# Patient Record
Sex: Male | Born: 1955 | Race: Black or African American | Hispanic: No | Marital: Single | Smoking: Current every day smoker
Health system: Southern US, Community
[De-identification: ages and names within clinical notes are randomized; demographics above are authoritative.]

## PROBLEM LIST (undated history)

## (undated) DIAGNOSIS — F259 Schizoaffective disorder, unspecified: Secondary | ICD-10-CM

## (undated) DIAGNOSIS — F319 Bipolar disorder, unspecified: Secondary | ICD-10-CM

## (undated) DIAGNOSIS — I10 Essential (primary) hypertension: Secondary | ICD-10-CM

## (undated) HISTORY — DX: Bipolar disorder, unspecified: F31.9

## (undated) HISTORY — DX: Schizoaffective disorder, unspecified: F25.9

---

## 1998-02-27 HISTORY — PX: HIP SURGERY: SHX245

## 2004-07-13 ENCOUNTER — Emergency Department: Payer: Self-pay | Admitting: General Practice

## 2007-04-23 ENCOUNTER — Emergency Department (HOSPITAL_COMMUNITY): Admission: EM | Admit: 2007-04-23 | Discharge: 2007-04-24 | Payer: Self-pay | Admitting: Emergency Medicine

## 2009-01-04 ENCOUNTER — Emergency Department: Payer: Self-pay | Admitting: Emergency Medicine

## 2010-11-18 LAB — I-STAT 8, (EC8 V) (CONVERTED LAB)
BUN: 11
Bicarbonate: 28.4 — ABNORMAL HIGH
Chloride: 104
HCT: 50
Hemoglobin: 17
Operator id: 277751
Potassium: 4.5
Sodium: 138

## 2012-12-22 ENCOUNTER — Emergency Department: Payer: Self-pay | Admitting: Emergency Medicine
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
## 2018-05-07 ENCOUNTER — Encounter: Payer: Self-pay | Admitting: Family Medicine

## 2018-05-07 ENCOUNTER — Ambulatory Visit: Payer: Medicaid Other | Admitting: Family Medicine

## 2018-05-07 VITALS — BP 172/108 | HR 67 | Temp 98.4°F | Resp 14 | Ht 66.5 in | Wt 193.8 lb

## 2018-05-07 DIAGNOSIS — Z1211 Encounter for screening for malignant neoplasm of colon: Secondary | ICD-10-CM

## 2018-05-07 DIAGNOSIS — Z23 Encounter for immunization: Secondary | ICD-10-CM | POA: Diagnosis not present

## 2018-05-07 DIAGNOSIS — Z114 Encounter for screening for human immunodeficiency virus [HIV]: Secondary | ICD-10-CM

## 2018-05-07 DIAGNOSIS — I10 Essential (primary) hypertension: Secondary | ICD-10-CM | POA: Diagnosis not present

## 2018-05-07 DIAGNOSIS — M25551 Pain in right hip: Secondary | ICD-10-CM

## 2018-05-07 DIAGNOSIS — Z5181 Encounter for therapeutic drug level monitoring: Secondary | ICD-10-CM

## 2018-05-07 DIAGNOSIS — Z1159 Encounter for screening for other viral diseases: Secondary | ICD-10-CM

## 2018-05-07 DIAGNOSIS — G8929 Other chronic pain: Secondary | ICD-10-CM

## 2018-05-07 DIAGNOSIS — R220 Localized swelling, mass and lump, head: Secondary | ICD-10-CM

## 2018-05-07 DIAGNOSIS — F209 Schizophrenia, unspecified: Secondary | ICD-10-CM

## 2018-05-07 DIAGNOSIS — H04203 Unspecified epiphora, bilateral lacrimal glands: Secondary | ICD-10-CM

## 2018-05-07 MED ORDER — AMLODIPINE BESYLATE 10 MG PO TABS: 10.0000 mg | ORAL_TABLET | Freq: Every day | ORAL | 0 refills | Status: DC

## 2018-05-07 NOTE — Patient Instructions (Addendum)
Start the new blood pressure medicine Try to follow the DASH guidelines (DASH stands for Dietary Approaches to Stop Hypertension). Try to limit the sodium in your diet to no more than 1,500mg  of sodium per day. Certainly try to not exceed 2,000 mg per day at the very most. Do not add salt when cooking or at the table.  Check the sodium amount on labels when shopping, and choose items lower in sodium when given a choice. Avoid or limit foods that already contain a lot of sodium. Eat a diet rich in fruits and vegetables and whole grains, and try to lose weight if overweight or obese We'll have you see the Ear Nose Throat doctor and orthopaedist and the eye doctor We'll get a scan of your neck/head We'll get labs today If you have not heard anything from my staff in a week about any orders/referrals/studies from today, please contact us here to follow-up [phone]
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
## 2018-05-08 ENCOUNTER — Telehealth: Payer: Self-pay

## 2018-05-08 ENCOUNTER — Other Ambulatory Visit: Payer: Self-pay

## 2018-05-08 ENCOUNTER — Other Ambulatory Visit: Payer: Self-pay | Admitting: Family Medicine

## 2018-05-08 DIAGNOSIS — E782 Mixed hyperlipidemia: Secondary | ICD-10-CM

## 2018-05-08 DIAGNOSIS — Z5181 Encounter for therapeutic drug level monitoring: Secondary | ICD-10-CM

## 2018-05-08 DIAGNOSIS — Z1211 Encounter for screening for malignant neoplasm of colon: Secondary | ICD-10-CM

## 2018-05-08 LAB — HEMOGLOBIN A1C
Hgb A1c MFr Bld: 5.6 % of total Hgb (ref <5.7)
Mean Plasma Glucose: 114 (calc)
eAG (mmol/L): 6.3 (calc)

## 2018-05-08 LAB — LIPID PANEL
Cholesterol: 175 mg/dL (ref <200)
HDL: 41 mg/dL (ref >=40)
LDL Cholesterol (Calc): 97 mg/dL (calc)
Non-HDL Cholesterol (Calc): 134 mg/dL (calc) — ABNORMAL HIGH (ref <130)
Total CHOL/HDL Ratio: 4.3 (calc) (ref <5.0)
Triglycerides: 242 mg/dL — ABNORMAL HIGH (ref <150)

## 2018-05-08 LAB — COMPLETE METABOLIC PANEL WITH GFR
AG RATIO: 1.4 (calc) (ref 1.0–2.5)
ALT: 14 U/L (ref 9–46)
AST: 16 U/L (ref 10–35)
Albumin: 4.2 g/dL (ref 3.6–5.1)
Alkaline phosphatase (APISO): 89 U/L (ref 35–144)
BILIRUBIN TOTAL: 0.6 mg/dL (ref 0.2–1.2)
BUN: 10 mg/dL (ref 7–25)
CHLORIDE: 105 mmol/L (ref 98–110)
CO2: 30 mmol/L (ref 20–32)
Calcium: 10.2 mg/dL (ref 8.6–10.3)
Creat: 1.12 mg/dL (ref 0.70–1.25)
GFR, EST AFRICAN AMERICAN: 82 mL/min/{1.73_m2} (ref >=60)
GFR, Est Non African American: 71 mL/min/{1.73_m2} (ref >=60)
GLUCOSE: 78 mg/dL (ref 65–99)
Globulin: 3.1 g/dL (calc) (ref 1.9–3.7)
POTASSIUM: 4.5 mmol/L (ref 3.5–5.3)
Sodium: 141 mmol/L (ref 135–146)
TOTAL PROTEIN: 7.3 g/dL (ref 6.1–8.1)

## 2018-05-08 LAB — HIV ANTIBODY (ROUTINE TESTING W REFLEX): HIV 1&2 Ab, 4th Generation: NONREACTIVE

## 2018-05-08 LAB — CBC
HEMATOCRIT: 46.7 % (ref 38.5–50.0)
HEMOGLOBIN: 16.2 g/dL (ref 13.2–17.1)
MCH: 29.9 pg (ref 27.0–33.0)
MCHC: 34.7 g/dL (ref 32.0–36.0)
MCV: 86.3 fL (ref 80.0–100.0)
MPV: 11.1 fL (ref 7.5–12.5)
Platelets: 191 10*3/uL (ref 140–400)
RBC: 5.41 10*6/uL (ref 4.20–5.80)
RDW: 13.5 % (ref 11.0–15.0)
WBC: 4.3 10*3/uL (ref 3.8–10.8)

## 2018-05-08 LAB — HEPATITIS C ANTIBODY
Hepatitis C Ab: NONREACTIVE
SIGNAL TO CUT-OFF: 0.11 (ref <1.00)

## 2018-05-08 MED ORDER — ATORVASTATIN CALCIUM 20 MG PO TABS: 20.0000 mg | ORAL_TABLET | Freq: Every day | ORAL | 1 refills | Status: DC

## 2018-05-08 NOTE — Progress Notes (Signed)
Start statin; recheck labs in 6 weeks

## 2018-05-08 NOTE — Telephone Encounter (Signed)
Gastroenterology Pre-Procedure Review  Request Date: 06/04/18 Requesting Physician: Dr. Vicente Males  PATIENT REVIEW QUESTIONS: The patient's daughter Ronald Bradford responded to the following health history questions as indicated:    1. Are you having any GI issues? no 2. Do you have a personal history of Polyps? no 3. Do you have a family history of Colon Cancer or Polyps? no 4. Diabetes Mellitus? no 5. Joint replacements in the past 12 months?no 6. Major health problems in the past 3 months?no 7. Any artificial heart valves, MVP, or defibrillator?no    MEDICATIONS & ALLERGIES:    Patient reports the following regarding taking any anticoagulation/antiplatelet therapy:   Plavix, Coumadin, Eliquis, Xarelto, Lovenox, Pradaxa, Brilinta, or Effient? no Aspirin? no  Patient confirms/reports the following medications:  Current Outpatient Medications  Medication Sig Dispense Refill  . amLODipine (NORVASC) 10 MG tablet Take 1 tablet (10 mg total) by mouth daily. 30 tablet 0  . risperiDONE (RISPERDAL) 3 MG tablet Take 3 mg by mouth daily.     No current facility-administered medications for this visit.     Patient confirms/reports the following allergies:  Allergies not on file  No orders of the defined types were placed in this encounter.   AUTHORIZATION INFORMATION Primary Insurance: 1D#: Group #:  Secondary Insurance: 1D#: Group #:  SCHEDULE INFORMATION: Date: 06/04/18 Time: Location:[facility]

## 2018-05-10 ENCOUNTER — Ambulatory Visit: Payer: Medicaid Other | Attending: Family Medicine

## 2018-05-10 ENCOUNTER — Ambulatory Visit: Payer: Medicaid Other

## 2018-05-12 ENCOUNTER — Encounter: Payer: Self-pay | Admitting: Family Medicine

## 2018-05-12 DIAGNOSIS — Z5181 Encounter for therapeutic drug level monitoring: Secondary | ICD-10-CM | POA: Insufficient documentation

## 2018-05-12 DIAGNOSIS — M25551 Pain in right hip: Secondary | ICD-10-CM

## 2018-05-12 DIAGNOSIS — R220 Localized swelling, mass and lump, head: Secondary | ICD-10-CM | POA: Insufficient documentation

## 2018-05-12 DIAGNOSIS — F209 Schizophrenia, unspecified: Secondary | ICD-10-CM | POA: Insufficient documentation

## 2018-05-12 DIAGNOSIS — G8929 Other chronic pain: Secondary | ICD-10-CM | POA: Insufficient documentation

## 2018-05-12 DIAGNOSIS — I10 Essential (primary) hypertension: Secondary | ICD-10-CM | POA: Insufficient documentation

## 2018-05-12 NOTE — Assessment & Plan Note (Signed)
On atypical antipsychotic, will check glucose, renal function, A1c

## 2018-05-12 NOTE — Assessment & Plan Note (Signed)
Uncontrolled; may have been uncontrolled for months or years; will check renal function before adding ARB or thiazide diuretic; will start with CCB, with close f/u for BP recheck with CMA later this week; DASH guidelines discussed, encouraged

## 2018-05-12 NOTE — Assessment & Plan Note (Signed)
Under the care of psychiatrist; patient agrees to not harm self or others, will seek help; they have crisis numbers; he is on medicine that can raise glucose and lpids, so I will check labs today as he does not recall any recent labs for those issues
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
## 2018-05-12 NOTE — Assessment & Plan Note (Signed)
Will get [place] and refer to ENT

## 2018-05-13 ENCOUNTER — Ambulatory Visit: Payer: Medicaid Other

## 2018-05-17 ENCOUNTER — Ambulatory Visit: Payer: Medicaid Other

## 2018-05-21 ENCOUNTER — Ambulatory Visit: Payer: Self-pay | Admitting: Nurse Practitioner

## 2018-05-21 ENCOUNTER — Ambulatory Visit: Payer: Medicaid Other | Admitting: Family Medicine

## 2018-05-22 ENCOUNTER — Telehealth: Payer: Self-pay

## 2018-05-22 NOTE — Telephone Encounter (Signed)
Left message with patients daughter to inform her father that his colonoscopy has been canceled for 06/04/18 due to COVID 19 and we will call back to reschedule once restrictions have been lifted.  Thanks [employer]

## 2018-05-28 ENCOUNTER — Telehealth: Payer: Self-pay

## 2018-05-28 NOTE — Telephone Encounter (Signed)
LVM asking patients daughter to call office to reschedule her fathers colonoscopy to June/July with Dr. Vicente Males.  Thanks [employer]

## 2018-06-04 ENCOUNTER — Encounter: Admission: RE | Payer: Self-pay | Source: Ambulatory Visit

## 2018-06-04 ENCOUNTER — Ambulatory Visit: Admission: RE | Admit: 2018-06-04 | Payer: Medicaid Other | Source: Ambulatory Visit | Admitting: Gastroenterology

## 2018-06-04 SURGERY — COLONOSCOPY WITH PROPOFOL
Anesthesia: General

## 2018-06-11 ENCOUNTER — Telehealth: Payer: Self-pay | Admitting: Family Medicine

## 2018-06-11 MED ORDER — AMLODIPINE BESYLATE 10 MG PO TABS: 10.0000 mg | ORAL_TABLET | Freq: Every day | ORAL | 0 refills | Status: DC

## 2018-06-11 NOTE — Telephone Encounter (Signed)
PEC not refilling medications for [facility] at this time

## 2018-06-11 NOTE — Telephone Encounter (Signed)
Patient was supposed to be seen by CMA in the office 3 days after last visit for BP check Please contact him about his BP; see if he has checked it elsewhere; I need to know what his BP is plesae

## 2018-06-12 NOTE — Telephone Encounter (Signed)
bp reading: 147/86 56

## 2018-06-12 NOTE — Telephone Encounter (Signed)
appt scheduled

## 2018-06-12 NOTE — Telephone Encounter (Signed)
Much improved BP; thank you Please schedule patient for a video visit in the next few weeks please

## 2018-06-25 ENCOUNTER — Ambulatory Visit: Payer: Medicaid Other | Admitting: Nurse Practitioner

## 2018-06-25 ENCOUNTER — Other Ambulatory Visit: Payer: Self-pay

## 2018-10-08 ENCOUNTER — Telehealth: Payer: Self-pay | Admitting: Family Medicine

## 2018-10-08 NOTE — Telephone Encounter (Signed)
Pt daughter is asking about 2 referrals that patient had before the covid. One to the hip doctor and one about a she called it a bubble in the mouth. Please give her a call to up date
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
## 2018-10-16 ENCOUNTER — Ambulatory Visit: Payer: Medicaid Other | Admitting: Nurse Practitioner

## 2018-10-18 ENCOUNTER — Encounter: Payer: Self-pay | Admitting: Nurse Practitioner

## 2018-10-18 ENCOUNTER — Other Ambulatory Visit: Payer: Self-pay

## 2018-10-18 ENCOUNTER — Ambulatory Visit (INDEPENDENT_AMBULATORY_CARE_PROVIDER_SITE_OTHER): Payer: Medicaid Other | Admitting: Nurse Practitioner

## 2018-10-18 VITALS — BP 182/102 | HR 66 | Temp 97.0°F | Resp 14 | Ht 66.5 in | Wt 203.1 lb

## 2018-10-18 DIAGNOSIS — G8929 Other chronic pain: Secondary | ICD-10-CM

## 2018-10-18 DIAGNOSIS — R220 Localized swelling, mass and lump, head: Secondary | ICD-10-CM

## 2018-10-18 DIAGNOSIS — I10 Essential (primary) hypertension: Secondary | ICD-10-CM

## 2018-10-18 DIAGNOSIS — F209 Schizophrenia, unspecified: Secondary | ICD-10-CM

## 2018-10-18 DIAGNOSIS — E785 Hyperlipidemia, unspecified: Secondary | ICD-10-CM | POA: Diagnosis not present

## 2018-10-18 DIAGNOSIS — M25551 Pain in right hip: Secondary | ICD-10-CM | POA: Diagnosis not present

## 2018-10-18 DIAGNOSIS — Z720 Tobacco use: Secondary | ICD-10-CM

## 2018-10-18 MED ORDER — AMLODIPINE BESYLATE 10 MG PO TABS: 10.0000 mg | ORAL_TABLET | Freq: Every day | ORAL | 0 refills | Status: DC

## 2018-10-18 MED ORDER — ATORVASTATIN CALCIUM 20 MG PO TABS: 20.0000 mg | ORAL_TABLET | Freq: Every day | ORAL | 3 refills | Status: DC

## 2018-10-18 NOTE — Progress Notes (Signed)
Name: Ronald Bradford   MRN: [id number]    DOB: [birth year]   Date:10/18/2018       Progress Note  Subjective  Chief Complaint  Chief Complaint  Patient presents with  . Follow-up  . Medication Refill  . referrals  . Hip Pain    HPI  Mass to right side of face States appeared about 15 years ago after dental infection and never was seen for it. Has not changed in size recently. Denies fevers, chills, unexplained weight loss. Does not chew tobacco.   Hypertension Patient is rx amlodipine 10mg  daily. He has not been taking this, has been out for about 2 months.  He is not compliant with low-salt diet.  Denies chest pain, headaches, blurry vision. BP Readings from Last 3 Encounters:  10/18/18 (!) 182/102  05/07/18 (!) 172/108    Hyperlipidemia Patient rx atorvastatin 20mg  daily. He has not been taking this  Diet: eats vegetables most days, rarely eating red meat  Denies myalgias Lab Results  Component Value Date   CHOL 175 05/07/2018   HDL 41 05/07/2018   LDLCALC 97 05/07/2018   TRIG 242 (H) 05/07/2018   CHOLHDL 4.3 05/07/2018    Schizophrenia Patient is taking risperdal 3mg  daily with no missed doses. Denies hallucinations or delusions. Affect is normal. Psychiatrist: [facility]  PHQ2/9: Depression screen [facility] 2/9 10/18/2018 05/07/2018  Decreased Interest 0 0  Down, Depressed, Hopeless 0 1  PHQ - 2 Score 0 1  Altered sleeping 0 1  Tired, decreased energy 0 1  Change in appetite 0 3  Feeling bad or failure about yourself  0 1  Trouble concentrating 0 1  Moving slowly or fidgety/restless 0 0  Suicidal thoughts 0 1  PHQ-9 Score 0 9  Difficult doing work/chores Not difficult at all Extremely dIfficult     PHQ reviewed. Negative  Patient Active Problem List   Diagnosis Date Noted  . Dyslipidemia 10/18/2018  . Tobacco use 10/18/2018  . Chronic pain of right hip 05/12/2018  . Localized swelling, mass and lump, head 05/12/2018  . Benign  hypertension 05/12/2018  . Medication monitoring encounter 05/12/2018  . Schizophrenia ([place]) 05/12/2018    Past Medical History:  Diagnosis Date  . Bipolar 1 disorder ([place])   . Schizoaffective disorder [facility])     Past Surgical History:  Procedure Laterality Date  . HIP SURGERY Right 2000   not sure what was done, not sure if ORIF, no pins    Social History   Tobacco Use  . Smoking status: Current Every Day Smoker    Packs/day: 1.00    Types: Cigarettes  . Smokeless tobacco: Former Network engineer Use Topics  . Alcohol use: Yes    Comment: on holidays     Current Outpatient Medications:  .  risperiDONE (RISPERDAL) 3 MG tablet, Take 3 mg by mouth daily., Disp: , Rfl:  .  amLODipine (NORVASC) 10 MG tablet, Take 1 tablet (10 mg total) by mouth daily., Disp: 30 tablet, Rfl: 0 .  atorvastatin (LIPITOR) 20 MG tablet, Take 1 tablet (20 mg total) by mouth at bedtime., Disp: 90 tablet, Rfl: 3  No Known Allergies  ROS    No other specific complaints in a complete review of systems (except as listed in HPI above).  Objective  Vitals:   10/18/18 1427 10/18/18 1523  BP: (!) 180/100 (!) 182/102  Pulse: 66   Resp: 14   Temp: (!) 97 F (36.1 C)   SpO2: 95%  Weight: 203 lb 1.6 oz (92.1 kg)   Height: 5' 6.5" (1.689 m)      Body mass index is 32.29 kg/m.  Nursing Note and Vital Signs reviewed.  Physical Exam Constitutional:      Appearance: Normal appearance.  HENT:     Head: Normocephalic and atraumatic.   Neck:     Musculoskeletal: No neck rigidity or muscular tenderness.  Cardiovascular:     Rate and Rhythm: Normal rate and regular rhythm.  Pulmonary:     Effort: Pulmonary effort is normal.     Breath sounds: Normal breath sounds.  Musculoskeletal: Normal range of motion.  Skin:    General: Skin is warm and dry.  Neurological:     General: No focal deficit present.     Mental Status: He is alert and oriented to person, place, and time.  Psychiatric:         Mood and Affect: Mood normal.        Behavior: Behavior normal.        Thought Content: Thought content normal.        Judgment: Judgment normal.        No results found for this or any previous visit (from the past 48 hour(s)).  Assessment & Plan  1. Benign hypertension Discussed red flag ER symptoms  - amLODipine (NORVASC) 10 MG tablet; Take 1 tablet (10 mg total) by mouth daily.  Dispense: 30 tablet; Refill: 0  2. Chronic pain of right hip Requesting ortho referral  - Ambulatory referral to Orthopedic Surgery  3. Schizophrenia, unspecified type ([place]) Stable, continue psych follow-up   4. Dyslipidemia - atorvastatin (LIPITOR) 20 MG tablet; Take 1 tablet (20 mg total) by mouth at bedtime.  Dispense: 90 tablet; Refill: 3  5. Tobacco use Smoking 3 cigarettes a day, working on cutting it out   6. Localized swelling, mass and lump, head Unchanged no need for urgent imaging  - Ambulatory referral to ENT

## 2018-10-18 NOTE — Patient Instructions (Addendum)
- please call 911 and get immediate medical attention if you develop chest pain, dizziness, trouble breathing, numbness or tingling or weakness of one side of your body.  DASH Eating Plan    Avoid eating more than 1,800 mg (milligrams) of salt (sodium) a day.  Limit alcohol intake to no more than 1 drink a day for nonpregnant women and 2 drinks a day for men. One drink equals 12 oz of beer, 5 oz of wine, or 1 oz of hard liquor.  Work with your health care provider to maintain a healthy body weight or to lose weight. Ask what an ideal weight is for you.  Get at least 30 minutes of exercise that causes your heart to beat faster (aerobic exercise) most days of the week. Activities may include walking, swimming, or biking.  Reading food labels  Check food labels for the amount of sodium per serving. Choose foods with less than 5 percent of the Daily Value of sodium. Generally, foods with less than 300 mg of sodium per serving fit into this eating plan.  To find whole grains, look for the word "whole" as the first word in the ingredient list. Shopping  Buy products labeled as "low-sodium" or "no salt added."  Buy fresh foods. Avoid canned foods and premade or frozen meals. Cooking  Avoid adding salt when cooking. Use salt-free seasonings or herbs instead of table salt or sea salt. Check with your health care provider or pharmacist before using salt substitutes.  Do not fry foods. Cook foods using healthy methods such as baking, boiling, grilling, and broiling instead.  Cook with heart-healthy oils, such as olive, canola, soybean, or sunflower oil. Meal planning  Eat a balanced diet that includes: ? 5 or more servings of fruits and vegetables each day. At each meal, try to fill half of your plate with fruits and vegetables. ? Up to 6-8 servings of whole grains each day. ? Less than 6 oz of lean meat, poultry, or fish each day. A 3-oz serving of meat is about the same size as a deck of  cards. One egg equals 1 oz. ? 2 servings of low-fat dairy each day. ? A serving of nuts, seeds, or beans 5 times each week. ? Heart-healthy fats. Healthy fats called Omega-3 fatty acids are found in foods such as flaxseeds and coldwater fish, like sardines, salmon, and mackerel.  Limit how much you eat of the following: ? Canned or prepackaged foods. ? Food that is high in trans fat, such as fried foods. ? Food that is high in saturated fat, such as fatty meat. ? Sweets, desserts, sugary drinks, and other foods with added sugar. ? Full-fat dairy products.  Do not salt foods before eating.  Try to eat at least 2 vegetarian meals each week.  Eat more home-cooked food and less restaurant, buffet, and fast food.  When eating at a restaurant, ask that your food be prepared with less salt or no salt, if possible. What foods are recommended? The items listed may not be a complete list. Talk with your dietitian about what dietary choices are best for you. Grains Whole-grain or whole-wheat bread. Whole-grain or whole-wheat pasta. Brown rice. [place]. Bulgur. Whole-grain and low-sodium cereals. Pita bread. Low-fat, low-sodium crackers. Whole-wheat flour tortillas. Vegetables Fresh or frozen vegetables (raw, steamed, roasted, or grilled). Low-sodium or reduced-sodium tomato and vegetable juice. Low-sodium or reduced-sodium tomato sauce and tomato paste. Low-sodium or reduced-sodium canned vegetables. Fruits All fresh, dried, or frozen fruit.  Canned fruit in natural juice (without added sugar). Meat and other protein foods Skinless chicken or [place]. Ground chicken or [place]. Pork with fat trimmed off. Fish and seafood. Egg whites. Dried beans, peas, or lentils. Unsalted nuts, nut butters, and seeds. Unsalted canned beans. Lean cuts of beef with fat trimmed off. Low-sodium, lean deli meat. Dairy Low-fat (1%) or fat-free (skim) milk. Fat-free, low-fat, or reduced-fat cheeses. Nonfat,  low-sodium ricotta or cottage cheese. Low-fat or nonfat yogurt. Low-fat, low-sodium cheese. Fats and oils Soft margarine without trans fats. Vegetable oil. Low-fat, reduced-fat, or light mayonnaise and salad dressings (reduced-sodium). Canola, safflower, olive, soybean, and sunflower oils. Avocado. Seasoning and other foods Herbs. Spices. Seasoning mixes without salt. Unsalted popcorn and pretzels. Fat-free sweets. What foods are not recommended? Grains Baked goods made with fat, such as croissants, muffins, or some breads. Dry pasta or rice meal packs. Vegetables Creamed or fried vegetables. Vegetables in a cheese sauce. Regular canned vegetables (not low-sodium or reduced-sodium). Regular canned tomato sauce and paste (not low-sodium or reduced-sodium). Regular tomato and vegetable juice (not low-sodium or reduced-sodium). Angie Fava. Olives. Fruits Canned fruit in a light or heavy syrup. Fried fruit. Fruit in cream or butter sauce. Meat and other protein foods Fatty cuts of meat. Ribs. Fried meat. Berniece Salines. Sausage. Bologna and other processed lunch meats. Salami. Fatback. Hotdogs. Bratwurst. Salted nuts and seeds. Canned beans with added salt. Canned or smoked fish. Whole eggs or egg yolks. Chicken or [place] with skin. Dairy Whole or 2% milk, cream, and half-and-half. Whole or full-fat cream cheese. Whole-fat or sweetened yogurt. Full-fat cheese. Nondairy creamers. Whipped toppings. Processed cheese and cheese spreads. Fats and oils Butter. Stick margarine. Lard. Shortening. Ghee. Bacon fat. Tropical oils, such as coconut, palm kernel, or palm oil. Seasoning and other foods Salted popcorn and pretzels. Onion salt, garlic salt, seasoned salt, table salt, and sea salt. Worcestershire sauce. Tartar sauce. Barbecue sauce. Teriyaki sauce. Soy sauce, including reduced-sodium. Steak sauce. Canned and packaged gravies. Fish sauce. Oyster sauce. Cocktail sauce. Horseradish that you find on the shelf.  Ketchup. Mustard. Meat flavorings and tenderizers. Bouillon cubes. Hot sauce and Tabasco sauce. Premade or packaged marinades. Premade or packaged taco seasonings. Relishes. Regular salad dressings. Where to find more information:  National Heart, Lung, and [place]: https://wilson-eaton.com/  American Heart Association: www.heart.org

## 2018-10-25 ENCOUNTER — Ambulatory Visit (INDEPENDENT_AMBULATORY_CARE_PROVIDER_SITE_OTHER): Payer: Medicaid Other | Admitting: Nurse Practitioner

## 2018-10-25 ENCOUNTER — Encounter: Payer: Self-pay | Admitting: Nurse Practitioner

## 2018-10-25 ENCOUNTER — Other Ambulatory Visit: Payer: Self-pay

## 2018-10-25 VITALS — BP 132/66 | HR 87 | Temp 97.1°F | Resp 14 | Ht 66.0 in | Wt 201.5 lb

## 2018-10-25 DIAGNOSIS — I10 Essential (primary) hypertension: Secondary | ICD-10-CM | POA: Diagnosis not present

## 2018-10-25 NOTE — Progress Notes (Signed)
Name: Ronald Bradford   MRN: [id number]    DOB: [birth year]   Date:10/25/2018       Progress Note  Subjective  Chief Complaint  Chief Complaint  Patient presents with  . Follow-up  . Hypertension    HPI  Patient presents for one-week follow-up on blood pressure.  He was prescribed amlodipine 10 mg but had been out of it for about 2 months and was very noncompliant with Dash guidelines.  Last visit started taking his amlodipine daily without any missed doses and has been eating healthier.  Denies headaches, chest pain, dizziness, blurry vision.   PHQ2/9: Depression screen [facility] 2/9 10/25/2018 10/18/2018 05/07/2018  Decreased Interest 0 0 0  Down, Depressed, Hopeless 0 0 1  PHQ - 2 Score 0 0 1  Altered sleeping 0 0 1  Tired, decreased energy 0 0 1  Change in appetite 0 0 3  Feeling bad or failure about yourself  0 0 1  Trouble concentrating 0 0 1  Moving slowly or fidgety/restless 0 0 0  Suicidal thoughts 0 0 1  PHQ-9 Score 0 0 9  Difficult doing work/chores Not difficult at all Not difficult at all Extremely dIfficult    PHQ reviewed. Negative  Patient Active Problem List   Diagnosis Date Noted  . Dyslipidemia 10/18/2018  . Tobacco use 10/18/2018  . Chronic pain of right hip 05/12/2018  . Localized swelling, mass and lump, head 05/12/2018  . Benign hypertension 05/12/2018  . Medication monitoring encounter 05/12/2018  . Schizophrenia ([place]) 05/12/2018    Past Medical History:  Diagnosis Date  . Bipolar 1 disorder ([place])   . Schizoaffective disorder [facility])     Past Surgical History:  Procedure Laterality Date  . HIP SURGERY Right 2000   not sure what was done, not sure if ORIF, no pins    Social History   Tobacco Use  . Smoking status: Current Every Day Smoker    Packs/day: 1.00    Types: Cigarettes  . Smokeless tobacco: Former Network engineer Use Topics  . Alcohol use: Yes    Comment: on holidays     Current Outpatient Medications:  .  amLODipine  (NORVASC) 10 MG tablet, Take 1 tablet (10 mg total) by mouth daily., Disp: 30 tablet, Rfl: 0 .  atorvastatin (LIPITOR) 20 MG tablet, Take 1 tablet (20 mg total) by mouth at bedtime., Disp: 90 tablet, Rfl: 3 .  risperiDONE (RISPERDAL) 3 MG tablet, Take 3 mg by mouth daily., Disp: , Rfl:   No Known Allergies  Review of Systems  Constitutional: Negative for chills, fever and malaise/fatigue.  Respiratory: Negative for cough and shortness of breath.   Cardiovascular: Negative for chest pain, palpitations and leg swelling.  Gastrointestinal: Negative for abdominal pain.  Musculoskeletal: Negative for joint pain and myalgias.  Skin: Negative for rash.  Neurological: Negative for dizziness and headaches.  Psychiatric/Behavioral: The patient is not nervous/anxious and does not have insomnia.      No other specific complaints in a complete review of systems (except as listed in HPI above).  Objective  Vitals:   10/25/18 1413 10/25/18 1427  BP: (!) 152/80 134/66  Pulse: 87   Resp: 14   Temp: (!) 97.1 F (36.2 C)   SpO2: 99%   Weight: 201 lb 8 oz (91.4 kg)   Height: 5\' 6"  (1.676 m)      Body mass index is 32.52 kg/m.  Nursing Note and Vital Signs reviewed.  Physical Exam Constitutional:  Appearance: Normal appearance. He is well-developed.  HENT:     Head: Normocephalic and atraumatic.     Right Ear: Hearing normal.     Left Ear: Hearing normal.  Eyes:     Conjunctiva/sclera: Conjunctivae normal.  Cardiovascular:     Rate and Rhythm: Normal rate and regular rhythm.     Heart sounds: Normal heart sounds.  Pulmonary:     Effort: Pulmonary effort is normal.     Breath sounds: Normal breath sounds.  Musculoskeletal: Normal range of motion.  Neurological:     Mental Status: He is alert and oriented to person, place, and time.  Psychiatric:        Speech: Speech normal.        Behavior: Behavior normal. Behavior is cooperative.        Thought Content: Thought content  normal.        Judgment: Judgment normal.        No results found for this or any previous visit (from the past 48 hour(s)).  Assessment & Plan  1. Benign hypertension Much improved continue meds, routine follow-up in 4 months.

## 2018-10-25 NOTE — Patient Instructions (Signed)
10 Tips to Follow the DASH Diet on a Budget   Eating healthy doesn't have to be expensive. The following list shares ten simple tips to get you eating the Dietary Approaches to Stop Hypertension (DASH) Diet way without depleting your bank account.  Plan meals and snacks for the week Decide which recipes to make based on your pantry and freezer staples. Then prepare your grocery list by checking the newspaper for sales, store specials, and coupons and making a list of all the items you intend to purchase.  Use your grocery store loyalty cards for extra rewards. Also, eat before you shop to keep from buying foods that aren't on your list.  Choose unprocessed foods Unprocessed (nature-made) foods are cheaper and more nutritious than processed (man-made) foods. They also give you a lot more control over the ingredients. Avoid anything that comes in a box 90 percent of the time.  The golden rule is to buy and eat more nature-made food than man-made food.  Purchase in [place] in bulk, especially when stores are having promotions. Foods such as meat, pasta, rice, and canned goods are easy to stockpile because they last a long time in the freezer or pantry. If they're on sale, buy as much as you can afford and store them until the next big sale.  Buy a side of beef or a family pack at the grocery store or meat market. When you get home, split it into zipper bags and freeze. This way you'll have better quality meat that will last you a long time. You can also purchase whole-grain breads that are on sale and freeze some for later use. They'll keep for up to three months in the freezer.  Consider shopping at discount stores as well. Some big-box stores offer deep discounts, and many have their own store brands. Most of the time you get an equal-quality product for so much less.  Select in-season produce Not only is in-season produce more readily available, but it also has a better flavor and is more  budget-friendly. Buy some fruit that still needs time to ripen if you don't plan to use it right away. If you have the space in your freezer, buy extra in-season produce and freeze some so you can have it on hand in the off-season months.  Berries, for example, are super easy to freeze. Just rinse, let dry, then place into zippered freezer bags (these come in handy for a frozen sweet treat or for yogurt smoothies).  Buy store brands Buying store brand items can save you quite a bit of cash without sacrificing important nutrients. When you compare different brands of canned vegetables or cottage cheese, for example, odds are the store brand is more economical for the same good quality.  Skip convenience foods Convenience foods (think pre-cut fruits and veggies) can really add up at the cash register. They can also be very high in sodium (think frozen dinners and meals-in-a-box). Preparing items yourself inevitably saves money and is usually healthier.  Buy food from local farmers The best-quality produce comes from your local farmers because the food doesn't have to travel very far to get to your table. Find out about [employer] underwriter) in your area, buy into a farmer's crop for the season, or hit up a nearby farmers' market.  Grow your own veggies and herbs Whether in a plot in your backyard or in a community garden, you can grow fresh, flavorful, and inexpensive produce for your meals. Fresh  herbs, tomatoes, spinach, salad greens, onions, and peppers are the easiest for the novice gardener to grow. You can even grow herbs in a pot on the kitchen counter.  Cook at home Eating out can be expensive. Save money by cooking meals at home. Prepare bigger batches of food and freeze some for later use in individual containers. Also, try incorporating leftovers into your meals. Cook a meal once and utilize it in a variety of ways for a few other days.  Go meatless once a week Buying  meat, poultry, and fish for every day of the week adds up. Try eating more plant-based protein (beans, peas, and lentils) as well as eggs and peanut butter, which are low-cost items available year-round.

## 2018-11-26 ENCOUNTER — Other Ambulatory Visit: Payer: Self-pay | Admitting: Family Medicine

## 2018-11-26 DIAGNOSIS — I10 Essential (primary) hypertension: Secondary | ICD-10-CM

## 2018-11-26 MED ORDER — AMLODIPINE BESYLATE 10 MG PO TABS: 10.0000 mg | ORAL_TABLET | Freq: Every day | ORAL | 0 refills | Status: DC

## 2018-11-26 NOTE — Telephone Encounter (Signed)
Requested medication (s) are due for refill today: yes  Requested medication (s) are on the active medication list: yes  Last refill:  10/18/2018  Future visit scheduled: yes  Notes to clinic:  Ordering provider and pcp are different   Requested Prescriptions  Pending Prescriptions Disp Refills   amLODipine (NORVASC) 10 MG tablet 30 tablet 0    Sig: Take 1 tablet (10 mg total) by mouth daily.     Cardiovascular:  Calcium Channel Blockers Passed - 11/26/2018 10:26 AM      Passed - Last BP in normal range    BP Readings from Last 1 Encounters:  10/25/18 132/66         Passed - Valid encounter within last 6 months    Recent Outpatient Visits          1 month ago Benign hypertension   [place], NP   1 month ago Benign hypertension   [place], NP   6 months ago Localized swelling, mass and lump, head   [facility] [place], Satira Anis, MD      Future Appointments            In 3 months Delsa Grana, PA-C [facility], [facility]

## 2018-11-26 NOTE — Telephone Encounter (Signed)
Medication Refill - Medication: amLODipine (NORVASC) 10 MG tablet    Preferred Pharmacy (with phone number or street name):  [id number] - [address], [place] [phone] (Phone) [phone] (Fax)

## 2018-12-30 ENCOUNTER — Other Ambulatory Visit: Payer: Self-pay

## 2018-12-30 ENCOUNTER — Telehealth: Payer: Self-pay | Admitting: Family Medicine

## 2018-12-30 ENCOUNTER — Telehealth: Payer: Self-pay

## 2018-12-30 DIAGNOSIS — I10 Essential (primary) hypertension: Secondary | ICD-10-CM

## 2018-12-30 MED ORDER — AMLODIPINE BESYLATE 10 MG PO TABS: 10.0000 mg | ORAL_TABLET | Freq: Every day | ORAL | 0 refills | Status: DC

## 2018-12-30 NOTE — Telephone Encounter (Signed)
Copied from [place] [phone]. Topic: Referral - Status >> Dec 30, 2018 11:33 AM Reyne Dumas L wrote: Reason for CRM:   Pt's daughter, Earlie Counts, calling to check on status of referral for bubble on pt's jaw. [place] can be reached at [phone]  [facility], do you know anything about this?

## 2018-12-30 NOTE — Telephone Encounter (Signed)
I called this patient to inform her of the message from [facility] (listed below) but there was no answer. A message was left informing her that as soon as his card has been corrected with our information on it then we will be able to proceed with this referral.   A CRM will be placed.  Type Date User Summary Attachment  General 10/22/2018 9:32 AM Elenora Fender, Laverda Sorenson, CMA - -  Note   Fax from [place] [facility] states that this patient's medicaid card has [facility] on it so they will not be able to scheduled until it has been changed to our office.

## 2018-12-30 NOTE — Telephone Encounter (Signed)
Medication Refill - Medication: amLODipine (NORVASC) 10 MG tablet  Has the patient contacted their pharmacy? yes (Agent: If no, request that the patient contact the pharmacy for the refill.) (Agent: If yes, when and what did the pharmacy advise?)  Preferred Pharmacy (with phone number or street name):  [id number] - [address], [place] - [place] [phone] (Phone) [phone] (Fax)   Agent: Please be advised that RX refills may take up to 3 business days. We ask that you follow-up with your pharmacy.

## 2019-02-24 ENCOUNTER — Ambulatory Visit: Payer: Medicaid Other | Admitting: Family Medicine

## 2019-04-08 ENCOUNTER — Other Ambulatory Visit: Payer: Self-pay

## 2019-04-08 DIAGNOSIS — I10 Essential (primary) hypertension: Secondary | ICD-10-CM

## 2019-04-08 MED ORDER — AMLODIPINE BESYLATE 10 MG PO TABS: 10.0000 mg | ORAL_TABLET | Freq: Every day | ORAL | 0 refills | Status: DC

## 2019-04-21 ENCOUNTER — Ambulatory Visit (INDEPENDENT_AMBULATORY_CARE_PROVIDER_SITE_OTHER): Payer: Medicaid Other | Admitting: Family Medicine

## 2019-04-21 DIAGNOSIS — Z91199 Patient's noncompliance with other medical treatment and regimen due to unspecified reason: Secondary | ICD-10-CM

## 2019-04-21 DIAGNOSIS — Z5329 Procedure and treatment not carried out because of patient's decision for other reasons: Secondary | ICD-10-CM

## 2019-04-25 NOTE — Progress Notes (Signed)
No answer

## 2019-06-09 ENCOUNTER — Other Ambulatory Visit: Payer: Self-pay | Admitting: Family Medicine

## 2019-06-09 DIAGNOSIS — I10 Essential (primary) hypertension: Secondary | ICD-10-CM

## 2019-06-09 MED ORDER — AMLODIPINE BESYLATE 10 MG PO TABS: 10.0000 mg | ORAL_TABLET | Freq: Every day | ORAL | 0 refills | Status: DC

## 2019-06-09 NOTE — Telephone Encounter (Signed)
Medication Refill - Medication: amLODipine (NORVASC) 10 MG tablet [id number]  Pt is out of meds but as appt in may for med refills    Has the patient contacted their pharmacy? No. (Agent: If no, request that the patient contact the pharmacy for the refill.) (Agent: If yes, when and what did the pharmacy advise?)  Preferred Pharmacy (with phone number or street name): [facility] NOT [facility] one on [address]  Agent: Please be advised that RX refills may take up to 3 business days. We ask that you follow-up with your pharmacy.

## 2019-06-09 NOTE — Telephone Encounter (Signed)
Pt has upcoming appt, approved for 30 days as courtesy refill. Requested Prescriptions  Pending Prescriptions Disp Refills  . amLODipine (NORVASC) 10 MG tablet 30 tablet 0    Sig: Take 1 tablet (10 mg total) by mouth daily.     Cardiovascular:  Calcium Channel Blockers Passed - 06/09/2019  2:20 PM      Passed - Last BP in normal range    BP Readings from Last 1 Encounters:  10/25/18 132/66         Passed - Valid encounter within last 6 months    Recent Outpatient Visits          1 month ago No-show for appointment   [facility] Delsa Grana, PA-C   7 months ago Benign hypertension   [place], NP   7 months ago Benign hypertension   [place], NP   1 year ago Localized swelling, mass and lump, head   [facility] [place], Satira Anis, MD      Future Appointments            In 1 month Delsa Grana, PA-C [facility], [facility]

## 2019-07-11 ENCOUNTER — Encounter: Payer: Self-pay | Admitting: Family Medicine

## 2019-07-11 ENCOUNTER — Ambulatory Visit (INDEPENDENT_AMBULATORY_CARE_PROVIDER_SITE_OTHER): Payer: Medicaid Other | Admitting: Family Medicine

## 2019-07-11 VITALS — Ht 69.0 in | Wt 225.0 lb

## 2019-07-11 DIAGNOSIS — G8929 Other chronic pain: Secondary | ICD-10-CM

## 2019-07-11 DIAGNOSIS — E785 Hyperlipidemia, unspecified: Secondary | ICD-10-CM | POA: Diagnosis not present

## 2019-07-11 DIAGNOSIS — I10 Essential (primary) hypertension: Secondary | ICD-10-CM | POA: Diagnosis not present

## 2019-07-11 DIAGNOSIS — F209 Schizophrenia, unspecified: Secondary | ICD-10-CM | POA: Diagnosis not present

## 2019-07-11 DIAGNOSIS — M25551 Pain in right hip: Secondary | ICD-10-CM

## 2019-07-11 DIAGNOSIS — Z7409 Other reduced mobility: Secondary | ICD-10-CM

## 2019-07-11 DIAGNOSIS — Z789 Other specified health status: Secondary | ICD-10-CM

## 2019-07-11 MED ORDER — AMLODIPINE BESYLATE 10 MG PO TABS: 10.0000 mg | ORAL_TABLET | Freq: Every day | ORAL | 3 refills | Status: DC

## 2019-07-11 MED ORDER — ATORVASTATIN CALCIUM 20 MG PO TABS: 20.0000 mg | ORAL_TABLET | Freq: Every day | ORAL | 3 refills | Status: AC

## 2019-07-11 NOTE — Progress Notes (Signed)
Name: Ronald Bradford   MRN: [id number]    DOB: 1955/12/13   Date:07/11/2019       Progress Note  Subjective:    I connected with  Ronald Bradford  on 07/11/19 at  1:20 PM EDT by a video enabled telemedicine application and verified that I am speaking with the correct person using two identifiers.  I discussed the limitations of evaluation and management by telemedicine and the availability of in person appointments. The patient expressed understanding and agreed to proceed. Staff also discussed with the patient that there may be a patient responsible charge related to this service. Patient Location: home Provider [facility] Additional Individuals present: daughter  Chief Complaint  Patient presents with  . Follow-up    patient would like a Education officer, museum for assistance with her Dad  . Hypertension  . Schizophrenia  . Manic Behavior    Ronald Bradford is a 64 y.o. male, presents for virtual visit for routine follow up on the conditions listed above.  High BP today-patient's daughter took his blood pressure in between her visit in his virtual visit today, notes that blood pressure at home was 175/95 he denies any headache, chest pain, shortness of breath, exertional symptoms, lower extremity edema, or disturbances.  Is a strong family history of hypertension, patient is in pain due to his hip, he is managed with amlodipine 10 mg daily HLD - on lipitor 20 mg daily, good compliance, no myalgias, CP  His daughter and him are on the call, she states that she needs help caring for the pt, that he has Medicaid but he should be qualifying for Medicare.  She had asked at [facility] behavioral for resources and help but they did not have anything that was helpful for her as far as taking care of him or assistance at home.  Patient's daughter states that she is starting to go to work and need to do more things for herself and she is having difficulty taking care of him. He stays in  bed all day every day  He comes out to smoke  Daughter states he is "basically bedridden" He needs help with bathing, getting dressed He can feed himself, but can't prepare food  Old right hip injury after accident with surgery >25 years, pts right hip started to be much more painful about 1.5 years ago. Daughter is having a harder time caring for him  He uses a cane, does come out to the porch to smoke but otherwise is sedentary  Pt goes to [facility] for schizophrenia and manic behavior only on Risperdal -no concerns with behavior or moods      Patient Active Problem List   Diagnosis Date Noted  . Dyslipidemia 10/18/2018  . Tobacco use 10/18/2018  . Chronic pain of right hip 05/12/2018  . Localized swelling, mass and lump, head 05/12/2018  . Benign hypertension 05/12/2018  . Medication monitoring encounter 05/12/2018  . Schizophrenia ([place]) 05/12/2018    Current Outpatient Medications:  .  amLODipine (NORVASC) 10 MG tablet, Take 1 tablet (10 mg total) by mouth daily., Disp: 30 tablet, Rfl: 0 .  atorvastatin (LIPITOR) 20 MG tablet, Take 1 tablet (20 mg total) by mouth at bedtime., Disp: 90 tablet, Rfl: 3 .  risperiDONE (RISPERDAL) 3 MG tablet, Take 3 mg by mouth daily., Disp: , Rfl:  No Known Allergies  Past Surgical History:  Procedure Laterality Date  . HIP SURGERY Right 2000   not sure what was  done, not sure if ORIF, no pins   Family History  Problem Relation Age of Onset  . Hypertension Mother   . Heart attack Father   . Hypertension Father   . Hypertension Sister   . Schizophrenia Sister   . Hypertension Daughter   . Heart attack Maternal Grandfather   . Cancer Brother   . Hypertension Brother   . Hypertension Brother   . Hypertension Brother   . Hypertension Brother   . Hypertension Brother    Social History   Socioeconomic History  . Marital status: Single    Spouse name: Not on file  . Number of children: 1  . Years of education: 40  .  Highest education level: Not on file  Occupational History  . Occupation: unemloyed: former Therapist, art  Tobacco Use  . Smoking status: Current Every Day Smoker    Packs/day: 1.00    Types: Cigarettes  . Smokeless tobacco: Former Network engineer and Sexual Activity  . Alcohol use: Yes    Comment: on holidays  . Drug use: Not Currently  . Sexual activity: Yes  Other Topics Concern  . Not on file  Social History Narrative  . Not on file   Social Determinants of Health   Financial Resource Strain:   . Difficulty of Paying Living Expenses:   Food Insecurity:   . Worried About Charity fundraiser in the Last Year:   . Arboriculturist in the Last Year:   Transportation Needs:   . Film/video editor (Medical):   [name] Lack of Transportation (Non-Medical):   Physical Activity:   . Days of Exercise per Week:   . Minutes of Exercise per Session:   Stress:   . Feeling of Stress :   Social Connections:   . Frequency of Communication with Friends and Family:   . Frequency of Social Gatherings with Friends and Family:   . Attends Religious Services:   . Active Member of Clubs or Organizations:   . Attends Archivist Meetings:   [name] Marital Status:   Intimate Partner Violence:   . Fear of Current or Ex-Partner:   . Emotionally Abused:   [name] Physically Abused:   . Sexually Abused:     Chart Review Today: I personally reviewed active problem list, medication list, allergies, family history, social history, health maintenance, notes from last encounter, lab results, imaging with the patient/caregiver today.   Review of Systems  10 Systems reviewed and are negative for acute change except as noted in the HPI.   Objective:    Virtual encounter, vitals limited, only able to obtain the following Today's Vitals   07/11/19 1146  Weight: 225 lb (102.1 kg)  Height: 5\' 9"  (1.753 m)   Body mass index is 33.23 kg/m. Nursing Note and Vital Signs reviewed.  Physical Exam Vitals  and nursing note reviewed.  Constitutional:      General: He is not in acute distress.    Appearance: He is not ill-appearing.  Pulmonary:     Effort: No respiratory distress.  Neurological:     Mental Status: He is alert.  Psychiatric:        Mood and Affect: Mood normal.        Behavior: Behavior normal.     PE limited by telephone encounter  No results found for this or any previous visit (from the past 72 hour(s)).    Fall Risk: Fall Risk  07/11/2019 10/25/2018 10/18/2018 05/07/2018  Falls  in the past year? 0 0 0 0  Number falls in past yr: 0 0 0 0  Injury with Fall? 0 0 0 0     Assessment and Plan:     ICD-10-CM   1. Benign hypertension  I10 amLODipine (NORVASC) 10 MG tablet   uncontrolled? with home BP cuff?  needs in person OV, exam and labs - pt and daughter notified to come in person ASAP for OV, no sx, discussed concerning sx  2. Dyslipidemia  E78.5 atorvastatin (LIPITOR) 20 MG tablet   on statin, no myalgias, due for labs - needs in person OV and labs - refills sent in  3. Chronic pain of right hip  M25.551 Ambulatory referral to Chronic Care Management Services   G89.29    worsening over the past year, pt will need to come in for assessment, may need ortho? Xray, difficult to say w/o examining him  4. Schizophrenia, unspecified type ([place])  F20.9    managed by [facility] behavioral, mood good, no recent manic episodes, behavior good  5. Impaired mobility and ADLs  Z74.09 Ambulatory referral to Chronic Care Management Services   Z78.9    per pts daughter report - walks with cane, hip pain worsening, needs assistance to care for him.  pt notified needs in person appt, refer to SW    I discussed the assessment and treatment plan with the patient. The patient was provided an opportunity to ask questions and all were answered. The patient agreed with the plan and demonstrated an understanding of the instructions.  The patient was advised to call back or seek an in-person  evaluation if the symptoms worsen or if the condition fails to improve as anticipated.  I provided 30+ minutes of non-face-to-face time during this encounter.  Delsa Grana, PA-C 07/11/19 1:26 PM

## 2019-08-11 ENCOUNTER — Ambulatory Visit: Payer: Self-pay | Admitting: *Deleted

## 2019-08-11 NOTE — Chronic Care Management (AMB) (Signed)
   Care Management   Social Work Note  08/11/2019 Name: Ronald Bradford MRN: [id number] DOB: 1956/01/22  Ronald Bradford is a 64 y.o. year old male who sees Delsa Grana, [place] for primary care. The CCM team was consulted for assistance with [employer] .   Phone call to patient's daughter to discuss to evaluate for patient's needs. Patient's daughter was traveling back home from out of town and requested that she call this social worker back later on today. SDOH (Social Determinants of Health) assessments performed: No     Outpatient Encounter Medications as of 08/11/2019  Medication Sig  . amLODipine (NORVASC) 10 MG tablet Take 1 tablet (10 mg total) by mouth daily.  [name] atorvastatin (LIPITOR) 20 MG tablet Take 1 tablet (20 mg total) by mouth at bedtime.  . risperiDONE (RISPERDAL) 3 MG tablet Take 3 mg by mouth daily.   No facility-administered encounter medications on file as of 08/11/2019.    Goals Addressed   None     Follow Up Plan: Client will call this social worker back later on today   [place], [place] Worker  [facility] Care Management [phone]

## 2019-08-15 ENCOUNTER — Ambulatory Visit: Payer: Self-pay | Admitting: *Deleted

## 2019-08-15 DIAGNOSIS — I10 Essential (primary) hypertension: Secondary | ICD-10-CM

## 2019-08-15 DIAGNOSIS — F209 Schizophrenia, unspecified: Secondary | ICD-10-CM

## 2019-08-15 DIAGNOSIS — Z789 Other specified health status: Secondary | ICD-10-CM

## 2019-08-18 NOTE — Patient Instructions (Addendum)
Thank you allowing the Chronic Care Management Team to be a part of your care! It was a pleasure speaking with you today!   Ronald Bradford was given information about Chronic Care Management services today including:  1. CM service includes personalized support from designated clinical staff supervised by his physician, including individualized plan of care and coordination with other care providers 2. 24/7 contact phone numbers for assistance for urgent and routine care needs. 3. The patient may stop CCM services at any time (effective at the end of the month) by phone call to the office staff.   CCM (Chronic Care Management) Team   Ronald Labella RN, BSN Nurse Care Coordinator  [phone]  Ronald Grass, LCSW Clinical Social Worker [phone]  Goals Addressed              This Visit's Progress   .  Per patient's daughter "I need some help in the home for my dad" (pt-stated)        CARE PLAN ENTRY (see longitudinal plan of care for additional care plan information)  Current Barriers:  . Inability to perform ADL's independently . Inability to perform IADL's independently  Clinical Social Work Clinical Goal(s):  [name] Over the next 90 days, patient will work with [facility] to address needs related to need for personal care assistance in the home  Interventions: . Patient's daughter interviewed and appropriate assessments performed . Patient's daughter confirmed  that she is patient's payee and is patient's primary caregiver . Per patient's daughter, patient is followed by [facility] for medication Management however need assistance in the home with ADL's . Provided patient's daughter with information about applying for personal care services for patient through [employer]  . Discussed difference between POA and guardianship and patient's daughter's plan to apply for guardianship for patient in the event that facility care may become  evident . Discussed plans with patient for ongoing care management follow up and provided patient with direct contact information for care management team  Patient Self Care Activities:  . Attends all scheduled provider appointments . Unable to perform ADLs independently . Unable to perform IADLs independently  Initial goal documentation         The patient verbalized understanding of instructions provided today and declined a print copy of patient instruction materials.   Telephone follow up appointment with care management team member scheduled for: 08/28/19

## 2019-08-18 NOTE — Chronic Care Management (AMB) (Signed)
  Care Management    Clinical Social Work General Note  08/18/2019 Name: Ronald Bradford MRN: [id number] DOB: 1955/12/25  Ronald Bradford is a 64 y.o. year old male who is a primary care patient of Ronald Bradford, [place]. The CCM was consulted to assist the patient with [employer] .   Ronald Bradford was given information about Chronic Care Management services today including:  1. CM service includes personalized support from designated clinical staff supervised by his physician, including individualized plan of care and coordination with other care providers 2. 24/7 contact phone numbers for assistance for urgent and routine care needs. 3. The patient may stop CCM services at any time (effective at the end of the month) by phone call to the office staff.  Patient agreed to services and verbal consent obtained.   Review of patient status, including review of consultants reports, relevant laboratory and other test results, and collaboration with appropriate care team members and the patient's provider was performed as part of comprehensive patient evaluation and provision of chronic care management services.    SDOH (Social Determinants of Health) assessments and interventions performed:  No    Outpatient Encounter Medications as of 08/15/2019  Medication Sig  . amLODipine (NORVASC) 10 MG tablet Take 1 tablet (10 mg total) by mouth daily.  Ronald Bradford atorvastatin (LIPITOR) 20 MG tablet Take 1 tablet (20 mg total) by mouth at bedtime.  . risperiDONE (RISPERDAL) 3 MG tablet Take 3 mg by mouth daily.   No facility-administered encounter medications on file as of 08/15/2019.    Goals Addressed              This Visit's Progress   .  Per patient's daughter "I need some help in the home for my dad" (pt-stated)        CARE PLAN ENTRY (see longitudinal plan of care for additional care plan information)  Current Barriers:  . Inability to perform ADL's independently . Inability to perform  IADL's independently  Clinical Social Work Clinical Goal(s):  Ronald Bradford Over the next 90 days, patient will work with Ronald Bradford to address needs related to need for personal care assistance in the home  Interventions: . Patient's daughter interviewed and appropriate assessments performed . Patient's daughter confirmed  that she is patient's payee and is patient's primary caregiver . Per patient's daughter, patient is followed by Ronald Bradford for medication Management however need assistance in the home with ADL's . Provided patient's daughter with information about applying for personal care services for patient through Ronald Bradford  . Discussed difference between POA and guardianship and patient's daughter's plan to apply for guardianship for patient in the event that facility care may become evident . Discussed plans with patient for ongoing care management follow up and provided patient with direct contact information for care management team  Patient Self Care Activities:  . Attends all scheduled provider appointments . Unable to perform ADLs independently . Unable to perform IADLs independently  Initial goal documentation         Follow Up Plan: SW will follow up with patient by phone over the next 7-15 business days       Ronald Bradford, Ronald Bradford  Ronald Bradford/[facility] Care Management [phone]

## 2019-08-28 ENCOUNTER — Ambulatory Visit: Payer: Self-pay | Admitting: *Deleted

## 2019-08-28 DIAGNOSIS — Z789 Other specified health status: Secondary | ICD-10-CM

## 2019-08-28 DIAGNOSIS — F209 Schizophrenia, unspecified: Secondary | ICD-10-CM

## 2019-08-28 NOTE — Patient Instructions (Signed)
Thank you allowing the Chronic Care Management Team to be a part of your care! It was a pleasure speaking with you today!  1. Please call this social worker with name of managed care plan CCM (Chronic Care Management) Team    Neldon Labella  RN, BSN Nurse Care Coordinator  [phone]  [place], [place] Social Worker [phone]  Goals Addressed              This Visit's Progress   .  Per patient's daughter "I need some help in the home for my dad" (pt-stated)        CARE PLAN ENTRY (see longitudinal plan of care for additional care plan information)  Current Barriers:  . Inability to perform ADL's independently . Inability to perform IADL's independently  Clinical Social Work Clinical Goal(s):  [name] Over the next 90 days, patient will work with [facility] to address needs related to need for personal care assistance in the home  Interventions: . Discussed with patient's daughter that patient now has a managed care medicaid plan and request for persona care services will need to go through them . Per patient's daughter, she is not sure what managed care plan patient is enrolled in however feels that it might be through [facility] . Patient's daughter will confirm and call this social worker back with the contact information . Discussed plans with patient for ongoing care management follow up and provided patient with direct contact information for care management team  Patient Self Care Activities:  . Attends all scheduled provider appointments . Unable to perform ADLs independently . Unable to perform IADLs independently  Please see past updates related to this goal by clicking on the "Past Updates" button in the selected goal          The patient verbalized understanding of instructions provided today and declined a print copy of patient instruction materials.   Telephone follow up appointment with care management team member scheduled for:   09/04/19

## 2019-08-28 NOTE — Chronic Care Management (AMB) (Signed)
°  Care Management    Clinical Social Work Follow Up Note  08/28/2019 Name: Ronald Bradford MRN: [id number] DOB: 08-05-1955  Ronald Bradford is a 64 y.o. year old male who is a primary care patient of Delsa Grana, [place]. The CCM team was consulted for assistance with [employer] .   Review of patient status, including review of consultants reports, other relevant assessments, and collaboration with appropriate care team members and the patient's provider was performed as part of comprehensive patient evaluation and provision of chronic care management services.    SDOH (Social Determinants of Health) assessments performed: No    Outpatient Encounter Medications as of 08/28/2019  Medication Sig   amLODipine (NORVASC) 10 MG tablet Take 1 tablet (10 mg total) by mouth daily.   atorvastatin (LIPITOR) 20 MG tablet Take 1 tablet (20 mg total) by mouth at bedtime.   risperiDONE (RISPERDAL) 3 MG tablet Take 3 mg by mouth daily.   No facility-administered encounter medications on file as of 08/28/2019.     Goals Addressed              This Visit's Progress     Per patient's daughter "I need some help in the home for my dad" (pt-stated)        CARE PLAN ENTRY (see longitudinal plan of care for additional care plan information)  Current Barriers:   Inability to perform ADL's independently  Inability to perform IADL's independently  Clinical Social Work Clinical Goal(s):   Over the next 90 days, patient will work with [facility] to address needs related to need for personal care assistance in the home  Interventions:  Discussed with patient's daughter that patient now has a managed care medicaid plan and request for persona care services will need to go through them  Per patient's daughter, she is not sure what managed care plan patient is enrolled in however feels that it might be through [facility]  Patient's daughter will confirm and call this social worker back  with the contact information  Discussed plans with patient for ongoing care management follow up and provided patient with direct contact information for care management team  Patient Self Care Activities:   Attends all scheduled provider appointments  Unable to perform ADLs independently  Unable to perform IADLs independently  Please see past updates related to this goal by clicking on the "Past Updates" button in the selected goal          Follow Up Plan: SW will follow up with patient by phone over the next 7-14 business days   Elliot Gurney, [place] Worker  [facility] Care Management [phone]

## 2019-09-02 ENCOUNTER — Ambulatory Visit: Payer: Self-pay | Admitting: *Deleted

## 2019-09-02 DIAGNOSIS — Z7409 Other reduced mobility: Secondary | ICD-10-CM

## 2019-09-02 DIAGNOSIS — F209 Schizophrenia, unspecified: Secondary | ICD-10-CM

## 2019-09-02 NOTE — Patient Instructions (Signed)
Thank you allowing the Chronic Care Management Team to be a part of your care! It was a pleasure speaking with you today!  1. Please call the [facility] Medicaid Plan [id number] to determine managed care plan patient has been assigned so that in home care can be coordianted.  CCM (Chronic Care Management) Team   Neldon Labella RN, BSN Nurse Care Coordinator  [phone]  [place], LCSW Clinical Social Worker [phone]  Goals Addressed              This Visit's Progress     Per patient's daughter "I need some help in the home for my dad" (pt-stated)        CARE PLAN ENTRY (see longitudinal plan of care for additional care plan information)  Current Barriers:   Inability to perform ADL's independently  Inability to perform IADL's independently  Clinical Social Work Clinical Goal(s):   Over the next 90 days, patient will work with [facility] to address needs related to need for personal care assistance in the home  Interventions:  Discussed with patient's daughter that patient now has a managed care medicaid plan and request for persona care services will need to go through them  Per patient's daughter, she is not sure what managed care plan patient is enrolled in however feels that it might be through [facility]  Multiple calls made to the [place] but plan that patient was assigned to could not be identified  Phone call to [facility] [phone], they could not provide the information as this social worker is not on consent. Patient's daughter states that she will call by tomorrow to identify managed care plan assigned.  Discussed plans with patient for ongoing care management follow up and provided patient with direct contact information for care management team  Patient Self Care Activities:   Attends all scheduled provider appointments  Unable to perform ADLs independently  Unable to perform IADLs independently  Please see past  updates related to this goal by clicking on the "Past Updates" button in the selected goal          The patient verbalized understanding of instructions provided today and declined a print copy of patient instruction materials.   Telephone follow up appointment with care management team member scheduled for:  09/04/19

## 2019-09-02 NOTE — Chronic Care Management (AMB) (Signed)
  Chronic Care Management    Clinical Social Work Follow Up Note  09/02/2019 Name: Ronald Bradford MRN: [id number] DOB: 1955/03/20  Ronald Bradford is a 64 y.o. year old male who is a primary care patient of Delsa Grana, [place]. The CCM team was consulted for assistance with [employer] .   Review of patient status, including review of consultants reports, other relevant assessments, and collaboration with appropriate care team members and the patient's provider was performed as part of comprehensive patient evaluation and provision of chronic care management services.    SDOH (Social Determinants of Health) assessments performed: No    Outpatient Encounter Medications as of 09/02/2019  Medication Sig  . amLODipine (NORVASC) 10 MG tablet Take 1 tablet (10 mg total) by mouth daily.  [name] atorvastatin (LIPITOR) 20 MG tablet Take 1 tablet (20 mg total) by mouth at bedtime.  . risperiDONE (RISPERDAL) 3 MG tablet Take 3 mg by mouth daily.   No facility-administered encounter medications on file as of 09/02/2019.     Goals Addressed              This Visit's Progress   .  Per patient's daughter "I need some help in the home for my dad" (pt-stated)        CARE PLAN ENTRY (see longitudinal plan of care for additional care plan information)  Current Barriers:  . Inability to perform ADL's independently . Inability to perform IADL's independently  Clinical Social Work Clinical Goal(s):  [name] Over the next 90 days, patient will work with [facility] to address needs related to need for personal care assistance in the home  Interventions: . Discussed with patient's daughter that patient now has a managed care medicaid plan and request for persona care services will need to go through them . Per patient's daughter, she is not sure what managed care plan patient is enrolled in however feels that it might be through [facility] . Multiple calls made to the Managed Care Plans but plan  that patient was assigned to could not be identified . Phone call to [facility] Medicaid Plans [phone], they could not provide the information as this social worker is not on consent. Patient's daughter states that she will call by tomorrow to identify managed care plan assigned. . Discussed plans with patient for ongoing care management follow up and provided patient with direct contact information for care management team  Patient Self Care Activities:  . Attends all scheduled provider appointments . Unable to perform ADLs independently . Unable to perform IADLs independently  Please see past updates related to this goal by clicking on the "Past Updates" button in the selected goal          Follow Up Plan: SW will follow up with patient by phone over the next 7-10 business days   Elliot Gurney, [place] Worker  [facility] Care Management [phone]

## 2019-09-04 ENCOUNTER — Telehealth: Payer: Self-pay

## 2019-09-08 ENCOUNTER — Ambulatory Visit: Payer: Self-pay | Admitting: *Deleted

## 2019-09-08 ENCOUNTER — Telehealth: Payer: Self-pay

## 2019-09-08 NOTE — Chronic Care Management (AMB) (Addendum)
° °   Care Management   Unsuccessful Call Note 09/08/2019 Name: Ronald Bradford MRN: [id number] DOB: 10-13-1955  Patient  is a 64 year old male who sees Delsa Grana, [place] for primary care. Delsa Grana, PA-C asked the CCM team to consult the patient for [facility].     This social worker was unable to reach patient's daughter  via telephone today for follow up regarding patient's [place]. I have left HIPAA compliant voicemail asking patient to return my call. (unsuccessful outreach #1).   Plan: Will follow-up within 7 business days via telephone.     Elliot Gurney, [place] Administrator, [facility] [phone]

## 2019-09-17 ENCOUNTER — Ambulatory Visit: Payer: Self-pay | Admitting: *Deleted

## 2019-09-17 DIAGNOSIS — Z789 Other specified health status: Secondary | ICD-10-CM

## 2019-09-17 DIAGNOSIS — F209 Schizophrenia, unspecified: Secondary | ICD-10-CM

## 2019-09-17 NOTE — Chronic Care Management (AMB) (Signed)
  Chronic Care Management    Clinical Social Work Follow Up Note  09/17/2019 Name: Ronald Bradford MRN: [id number] DOB: 06-02-1955  Ronald Bradford is a 64 y.o. year old male who is a primary care patient of Delsa Grana, [place]. The CCM team was consulted for assistance with [employer] .   Review of patient status, including review of consultants reports, other relevant assessments, and collaboration with appropriate care team members and the patient's provider was performed as part of comprehensive patient evaluation and provision of chronic care management services.    SDOH (Social Determinants of Health) assessments performed: No    Outpatient Encounter Medications as of 09/17/2019  Medication Sig  . amLODipine (NORVASC) 10 MG tablet Take 1 tablet (10 mg total) by mouth daily.  [name] atorvastatin (LIPITOR) 20 MG tablet Take 1 tablet (20 mg total) by mouth at bedtime.  . risperiDONE (RISPERDAL) 3 MG tablet Take 3 mg by mouth daily.   No facility-administered encounter medications on file as of 09/17/2019.     Goals Addressed              This Visit's Progress   .  Per patient's daughter "I need some help in the home for my dad" (pt-stated)        CARE PLAN ENTRY (see longitudinal plan of care for additional care plan information)  Current Barriers:  . Inability to perform ADL's independently . Inability to perform IADL's independently  Clinical Social Work Clinical Goal(s):  [name] Over the next 90 days, patient will work with [facility] to address needs related to need for personal care assistance in the home  Interventions: . Discussed with patient's daughter that patient now has a managed care medicaid plan and request for persona care services will need to go through them . Per patient's daughter, she is not sure what managed care plan patient is enrolled in however feels that it might be through [facility] . Per patient's daughter-she has not been able to confirm  the above information and requested that I allow her to call back when the information is received . Discussed plans with patient for ongoing care management follow up and provided patient with direct contact information for care management team with understanding to return call once Managed Care information is received.  Patient Self Care Activities:  . Attends all scheduled provider appointments . Unable to perform ADLs independently . Unable to perform IADLs independently  Please see past updates related to this goal by clicking on the "Past Updates" button in the selected goal          Follow Up Plan: Client's daughter will call this social worker once managed care information is received   Elliot Gurney, [facility] Care Management [phone]

## 2019-09-17 NOTE — Patient Instructions (Signed)
Thank you allowing the Chronic Care Management Team to be a part of your care! It was a pleasure speaking with you today!  1. Please call this social worker once the Managed Care information is received  CCM (Chronic Care Management) Team   Trish Fountain RN, BSN Nurse Care Coordinator  [phone]  Ruben Reason PharmD  Clinical Pharmacist  [phone]   [place], LCSW Clinical Social Worker [phone]  Goals Addressed              This Visit's Progress     Per patient's daughter "I need some help in the home for my dad" (pt-stated)        [place] (see longitudinal plan of care for additional care plan information)  Current Barriers:   Inability to perform ADL's independently  Inability to perform IADL's independently  Clinical Social Work Clinical Goal(s):   Over the next 90 days, patient will work with [facility] to address needs related to need for personal care assistance in the home  Interventions:  Discussed with patient's daughter that patient now has a managed care medicaid plan and request for persona care services will need to go through them  Per patient's daughter, she is not sure what managed care plan patient is enrolled in however feels that it might be through [facility]  Per patient's daughter-she has not been able to confirm the above information and requested that I allow her to call back when the information is received  Discussed plans with patient for ongoing care management follow up and provided patient with direct contact information for care management team with understanding to return call once Managed Care information is received.  Patient Self Care Activities:   Attends all scheduled provider appointments  Unable to perform ADLs independently  Unable to perform IADLs independently  Please see past updates related to this goal by clicking on the "Past Updates" button in the selected goal          The  patient verbalized understanding of instructions provided today and declined a print copy of patient instruction materials.   No further follow up required: patient's daughter will call this social worker when she obtains patient's managed care information-per her request

## 2019-10-16 ENCOUNTER — Ambulatory Visit: Payer: Self-pay | Admitting: *Deleted

## 2019-10-16 ENCOUNTER — Telehealth: Payer: Self-pay

## 2019-10-16 DIAGNOSIS — F209 Schizophrenia, unspecified: Secondary | ICD-10-CM

## 2019-10-16 DIAGNOSIS — Z7409 Other reduced mobility: Secondary | ICD-10-CM

## 2019-10-16 NOTE — Chronic Care Management (AMB) (Signed)
   Care Management    Clinical Social Work Follow Up Note  10/16/2019 Name: Ronald Bradford MRN: [id number] DOB: 20-Mar-1955  [name] is a 64 y.o. year old male who is a primary care patient of Delsa Grana, [place]. The CCM team was consulted for assistance with [employer] .   Review of patient status, including review of consultants reports, other relevant assessments, and collaboration with appropriate care team members and the patient's provider was performed as part of comprehensive patient evaluation and provision of chronic care management services.    SDOH (Social Determinants of Health) assessments performed: No    Outpatient Encounter Medications as of 10/16/2019  Medication Sig  . amLODipine (NORVASC) 10 MG tablet Take 1 tablet (10 mg total) by mouth daily.  [name] atorvastatin (LIPITOR) 20 MG tablet Take 1 tablet (20 mg total) by mouth at bedtime.  . risperiDONE (RISPERDAL) 3 MG tablet Take 3 mg by mouth daily.   No facility-administered encounter medications on file as of 10/16/2019.     Goals Addressed              This Visit's Progress   .  Per patient's daughter "I need some help in the home for my dad" (pt-stated)        CARE PLAN ENTRY (see longitudinal plan of care for additional care plan information)  Current Barriers:  . Inability to perform ADL's independently . Inability to perform IADL's independently  Clinical Social Work Clinical Goal(s):  [name] Over the next 90 days, patient will work with [facility] to address needs related to need for personal care assistance in the home  Interventions: . Return phone call to patient's daughter who confirms that patient has not been assigned to a Managed Medicare Plan  . Per patient's daughter, she has spoken to [facility] and is working on obtaining consent to be patient's authorized designee . Patient's daughter remains interested in personal care services for patient . This Education officer, museum will  request updated personal care services form to be completed by patient's provider and will fax to [facility] once complete . Discussed plans with patient for ongoing care management follow up and provided patient with direct contact information for care management team with understanding to return call once Managed Care information is received.  Patient Self Care Activities:  . Attends all scheduled provider appointments . Unable to perform ADLs independently . Unable to perform IADLs independently  Please see past updates related to this goal by clicking on the "Past Updates" button in the selected goal          Follow Up Plan: SW will follow up with patient by phone over the next 7-10 business days   Elliot Gurney, [place] Worker  [facility] Care Management [phone]

## 2019-10-16 NOTE — Patient Instructions (Signed)
Thank you allowing the Chronic Care Management Team to be a part of your care! It was a pleasure speaking with you today!  1. This Education officer, museum will request updated request form for personal care services  CCM (Chronic Care Management) Team   Neldon Labella RN, BSN Nurse Care Coordinator  [phone]  [place], LCSW Clinical Social Worker [phone]  Goals Addressed              This Visit's Progress   .  Per patient's daughter "I need some help in the home for my dad" (pt-stated)        CARE PLAN ENTRY (see longitudinal plan of care for additional care plan information)  Current Barriers:  . Inability to perform ADL's independently . Inability to perform IADL's independently  Clinical Social Work Clinical Goal(s):  [name] Over the next 90 days, patient will work with [facility] to address needs related to need for personal care assistance in the home  Interventions: . Return phone call to patient's daughter who confirms that patient has not been assigned to a Managed Medicare Plan  . Per patient's daughter, she has spoken to [facility] and is working on obtaining consent to be patient's authorized designee . Patient's daughter remains interested in personal care services for patient . This Education officer, museum will request updated personal care services form to be completed by patient's provider and will fax to [facility] once complete . Discussed plans with patient for ongoing care management follow up and provided patient with direct contact information for care management team with understanding to return call once Managed Care information is received.  Patient Self Care Activities:  . Attends all scheduled provider appointments . Unable to perform ADLs independently . Unable to perform IADLs independently  Please see past updates related to this goal by clicking on the "Past Updates" button in the selected goal          The patient verbalized  understanding of instructions provided today and declined a print copy of patient instruction materials.   Telephone follow up appointment with care management team member scheduled for:  10/27/19

## 2019-10-27 ENCOUNTER — Ambulatory Visit: Payer: Self-pay | Admitting: *Deleted

## 2019-10-27 DIAGNOSIS — Z7409 Other reduced mobility: Secondary | ICD-10-CM

## 2019-10-27 DIAGNOSIS — G8929 Other chronic pain: Secondary | ICD-10-CM

## 2019-10-27 NOTE — Patient Instructions (Signed)
Thank you allowing the Chronic Care Management Team to be a part of your care! It was a pleasure speaking with you today!  1. Please call this social worker with the date and time of in home evaluation for personal care services  CCM (Chronic Care Management) Team   Neldon Labella RN, BSN Nurse Care Coordinator  [phone]  [place], LCSW Clinical Social Worker [phone]  Goals Addressed              This Visit's Progress   .  Per patient's daughter "I need some help in the home for my dad" (pt-stated)        CARE PLAN ENTRY (see longitudinal plan of care for additional care plan information)  Current Barriers:  . Inability to perform ADL's independently . Inability to perform IADL's independently  Clinical Social Work Clinical Goal(s):  [name] Over the next 90 days, patient will work with [facility] to address needs related to need for personal care assistance in the home  Interventions: . Phone call to patient's daughter who confirms that patient has been scheduled for an evaluation with [place] for personal care services . Per patient's daughter, she has spoken to [facility] and his initial evaluation for personal care services has been scheduled-she was not at home at the time of the call and could not remember the date or time . Patient's daughter will call back with the date and time of the evaluation when she returns home . Discussed plans with patient for ongoing care management follow up and provided patient with direct contact information for care management team with understanding to return call once date and time of in home evaluation is received.  Patient Self Care Activities:  . Attends all scheduled provider appointments . Unable to perform ADLs independently . Unable to perform IADLs independently  Please see past updates related to this goal by clicking on the "Past Updates" button in the selected goal           The patient verbalized understanding of instructions provided today and declined a print copy of patient instruction materials.   Telephone follow up appointment with care management team member scheduled for:  11/05/19

## 2019-10-27 NOTE — Chronic Care Management (AMB) (Signed)
   Care Management    Clinical Social Work Follow Up Note  10/27/2019 Name: Ronald Bradford MRN: [id number] DOB: 11-15-1955  Ronald Bradford is a 64 y.o. year old male who is a primary care patient of Delsa Grana, [place]. The CCM team was consulted for assistance with [employer] .   Review of patient status, including review of consultants reports, other relevant assessments, and collaboration with appropriate care team members and the patient's provider was performed as part of comprehensive patient evaluation and provision of chronic care management services.    SDOH (Social Determinants of Health) assessments performed: No    Outpatient Encounter Medications as of 10/27/2019  Medication Sig  . amLODipine (NORVASC) 10 MG tablet Take 1 tablet (10 mg total) by mouth daily.  [name] atorvastatin (LIPITOR) 20 MG tablet Take 1 tablet (20 mg total) by mouth at bedtime.  . risperiDONE (RISPERDAL) 3 MG tablet Take 3 mg by mouth daily.   No facility-administered encounter medications on file as of 10/27/2019.     Goals Addressed              This Visit's Progress   .  Per patient's daughter "I need some help in the home for my dad" (pt-stated)        CARE PLAN ENTRY (see longitudinal plan of care for additional care plan information)  Current Barriers:  . Inability to perform ADL's independently . Inability to perform IADL's independently  Clinical Social Work Clinical Goal(s):  [name] Over the next 90 days, patient will work with [facility] to address needs related to need for personal care assistance in the home  Interventions: . Phone call to patient's daughter who confirms that patient has been scheduled for an evaluation with [place] for personal care services . Per patient's daughter, she has spoken to [facility] and his initial evaluation for personal care services has been scheduled-she was not at home at the time of the call and could not  remember the date or time . Patient's daughter will call back with the date and time of the evaluation when she returns home . Discussed plans with patient for ongoing care management follow up and provided patient with direct contact information for care management team with understanding to return call once date and time of in home evaluation is received.  Patient Self Care Activities:  . Attends all scheduled provider appointments . Unable to perform ADLs independently . Unable to perform IADLs independently  Please see past updates related to this goal by clicking on the "Past Updates" button in the selected goal          Follow Up Plan: SW will follow up with patient by phone over the next 7-10 business days   Elliot Gurney, [place] Worker  [facility] Care Management [phone]

## 2019-11-05 ENCOUNTER — Telehealth: Payer: Self-pay

## 2019-11-06 ENCOUNTER — Telehealth: Payer: Self-pay

## 2019-11-07 ENCOUNTER — Telehealth: Payer: Self-pay | Admitting: *Deleted

## 2019-11-07 NOTE — Telephone Encounter (Signed)
    Care Management   Unsuccessful Call Note 11/07/2019 Name: Ronald Bradford MRN: [id number] DOB: 1956-01-15  Patient  is a 64 year old male who sees Delsa Grana, [place] for primary care. Delsa Grana, PA-C asked the CCM team to consult the patient for [facility].     This social worker was unable to reach patient's daughter via telephone today for follow up call regarding in home care being arranged. I have left HIPAA compliant voicemail asking patient to return my call. (unsuccessful outreach #1).   Plan: Will follow-up within 7 business days via telephone.     Elliot Gurney, [place] Administrator, [facility] Care Management [phone]

## 2019-11-12 ENCOUNTER — Ambulatory Visit: Payer: Self-pay | Admitting: *Deleted

## 2019-11-12 NOTE — Chronic Care Management (AMB) (Signed)
   Care Management   Social Work Note  11/12/2019 Name: Ronald Bradford MRN: [id number] DOB: [birth year]  Ronald Bradford is a 64 y.o. year old male who sees Delsa Grana, [place] for primary care. The CCM team was consulted for assistance with Mental Health Counseling and Resources.  Message received from patient's daughter stating that patient had been approved for in home care, however the aid assigned cancelled twice. She would like a new agency. Return phone call to patient's daughter, she did not answer. Contact number for [facility] [phone] provided to request a new in home aid.  SDOH (Social Determinants of Health) assessments performed: No     Outpatient Encounter Medications as of 11/12/2019  Medication Sig  . amLODipine (NORVASC) 10 MG tablet Take 1 tablet (10 mg total) by mouth daily.  [name] atorvastatin (LIPITOR) 20 MG tablet Take 1 tablet (20 mg total) by mouth at bedtime.  . risperiDONE (RISPERDAL) 3 MG tablet Take 3 mg by mouth daily.   No facility-administered encounter medications on file as of 11/12/2019.    Goals Addressed   None     Follow Up Plan: SW will follow up with patient by phone over the next 7-10business days  [place], [facility] Care Management [phone]

## 2019-11-13 ENCOUNTER — Ambulatory Visit: Payer: Self-pay | Admitting: *Deleted

## 2019-11-13 DIAGNOSIS — Z789 Other specified health status: Secondary | ICD-10-CM

## 2019-11-13 DIAGNOSIS — G8929 Other chronic pain: Secondary | ICD-10-CM

## 2019-11-13 NOTE — Patient Instructions (Signed)
Thank you allowing the Chronic Care Management Team to be a part of your care! It was a pleasure speaking with you today!  1. Please contact [place] to request change in home care agency for in home care  CCM (Chronic Care Management) Team   Neldon Labella RN, BSN Nurse Care Coordinator  [phone]  [place], LCSW Clinical Social Worker [phone]  Goals Addressed              This Visit's Progress   .  Per patient's daughter "I need some help in the home for my dad" (pt-stated)        CARE PLAN ENTRY (see longitudinal plan of care for additional care plan information)  Current Barriers:  . Inability to perform ADL's independently . Inability to perform IADL's independently  Clinical Social Work Clinical Goal(s):  [name] Over the next 90 days, patient will work with [facility] to address needs related to need for personal care assistance in the home  Interventions: . Phone call to patient's daughter who confirmed that she would like to change home care agencies due to multiple no-shows . Contact information provided for [facility] as well as patient's insurance information so that change in home care agency can be made . Discussed plans with patient for ongoing care management follow up and provided patient with direct contact information for care management team with understanding to return call once date and time of in home evaluation is received.  Patient Self Care Activities:  . Attends all scheduled provider appointments . Unable to perform ADLs independently . Unable to perform IADLs independently  Please see past updates related to this goal by clicking on the "Past Updates" button in the selected goal          The patient verbalized understanding of instructions provided today and declined a print copy of patient instruction materials.   No further follow up required: patient's daughter to call this social worker with  anny additonal community resource needs

## 2019-11-13 NOTE — Chronic Care Management (AMB) (Signed)
   Care Management    Clinical Social Work Follow Up Note  11/13/2019 Name: Ronald Bradford MRN: [id number] DOB: 06/25/1955  Ronald Bradford is a 64 y.o. year old male who is a primary care patient of Delsa Grana, [place]. The CCM team was consulted for assistance with [employer] .   Review of patient status, including review of consultants reports, other relevant assessments, and collaboration with appropriate care team members and the patient's provider was performed as part of comprehensive patient evaluation and provision of chronic care management services.    SDOH (Social Determinants of Health) assessments performed: No    Outpatient Encounter Medications as of 11/13/2019  Medication Sig  . amLODipine (NORVASC) 10 MG tablet Take 1 tablet (10 mg total) by mouth daily.  [name] atorvastatin (LIPITOR) 20 MG tablet Take 1 tablet (20 mg total) by mouth at bedtime.  . risperiDONE (RISPERDAL) 3 MG tablet Take 3 mg by mouth daily.   No facility-administered encounter medications on file as of 11/13/2019.     Goals Addressed              This Visit's Progress   .  Per patient's daughter "I need some help in the home for my dad" (pt-stated)        CARE PLAN ENTRY (see longitudinal plan of care for additional care plan information)  Current Barriers:  . Inability to perform ADL's independently . Inability to perform IADL's independently  Clinical Social Work Clinical Goal(s):  [name] Over the next 90 days, patient will work with [facility] to address needs related to need for personal care assistance in the home  Interventions: . Phone call to patient's daughter who confirmed that she would like to change home care agencies due to multiple no-shows . Contact information provided for [facility] as well as patient's insurance information so that change in home care agency can be made . Discussed plans with patient for ongoing care management follow up and  provided patient with direct contact information for care management team with understanding to return call once date and time of in home evaluation is received.  Patient Self Care Activities:  . Attends all scheduled provider appointments . Unable to perform ADLs independently . Unable to perform IADLs independently  Please see past updates related to this goal by clicking on the "Past Updates" button in the selected goal          Follow Up Plan: Client's daughter  will call this social worker with any additonal community resource needs   [place], [place] [facility] Care Management [phone]

## 2019-11-14 ENCOUNTER — Telehealth: Payer: Self-pay

## 2020-12-31 ENCOUNTER — Encounter: Payer: Medicaid Other | Admitting: Family Medicine

## 2020-12-31 DIAGNOSIS — F209 Schizophrenia, unspecified: Secondary | ICD-10-CM

## 2020-12-31 DIAGNOSIS — E785 Hyperlipidemia, unspecified: Secondary | ICD-10-CM

## 2020-12-31 DIAGNOSIS — Z1211 Encounter for screening for malignant neoplasm of colon: Secondary | ICD-10-CM

## 2020-12-31 DIAGNOSIS — I10 Essential (primary) hypertension: Secondary | ICD-10-CM

## 2020-12-31 DIAGNOSIS — Z0000 Encounter for general adult medical examination without abnormal findings: Secondary | ICD-10-CM

## 2021-02-01 ENCOUNTER — Encounter: Payer: Self-pay | Admitting: Internal Medicine

## 2021-02-01 ENCOUNTER — Ambulatory Visit (INDEPENDENT_AMBULATORY_CARE_PROVIDER_SITE_OTHER): Payer: Medicaid Other | Admitting: Internal Medicine

## 2021-02-01 VITALS — BP 145/75 | HR 88 | Temp 97.6°F | Resp 16 | Wt 212.4 lb

## 2021-02-01 DIAGNOSIS — R600 Localized edema: Secondary | ICD-10-CM

## 2021-02-01 DIAGNOSIS — E785 Hyperlipidemia, unspecified: Secondary | ICD-10-CM | POA: Diagnosis not present

## 2021-02-01 DIAGNOSIS — Z23 Encounter for immunization: Secondary | ICD-10-CM | POA: Diagnosis not present

## 2021-02-01 DIAGNOSIS — I10 Essential (primary) hypertension: Secondary | ICD-10-CM

## 2021-02-01 DIAGNOSIS — M7989 Other specified soft tissue disorders: Secondary | ICD-10-CM | POA: Diagnosis not present

## 2021-02-01 LAB — LIPID PANEL
Cholesterol: 124 mg/dL (ref <200)
HDL: 36 mg/dL — ABNORMAL LOW (ref >=40)
LDL Cholesterol (Calc): 61 mg/dL (calc)
Non-HDL Cholesterol (Calc): 88 mg/dL (calc) (ref <130)
Total CHOL/HDL Ratio: 3.4 (calc) (ref <5.0)
Triglycerides: 208 mg/dL — ABNORMAL HIGH (ref <150)

## 2021-02-01 LAB — CBC WITH DIFFERENTIAL/PLATELET
Absolute Monocytes: 482 cells/uL (ref 200–950)
Basophils Absolute: 30 cells/uL (ref 0–200)
Basophils Relative: 0.7 %
Eosinophils Absolute: 99 cells/uL (ref 15–500)
Eosinophils Relative: 2.3 %
HCT: 51.5 % — ABNORMAL HIGH (ref 38.5–50.0)
Hemoglobin: 17.5 g/dL — ABNORMAL HIGH (ref 13.2–17.1)
Lymphs Abs: 1694 cells/uL (ref 850–3900)
MCH: 29.9 pg (ref 27.0–33.0)
MCHC: 34 g/dL (ref 32.0–36.0)
MCV: 88 fL (ref 80.0–100.0)
MPV: 10.7 fL (ref 7.5–12.5)
Monocytes Relative: 11.2 %
Neutro Abs: 1995 cells/uL (ref 1500–7800)
Neutrophils Relative %: 46.4 %
Platelets: 297 10*3/uL (ref 140–400)
RBC: 5.85 10*6/uL — ABNORMAL HIGH (ref 4.20–5.80)
RDW: 14.7 % (ref 11.0–15.0)
Total Lymphocyte: 39.4 %
WBC: 4.3 10*3/uL (ref 3.8–10.8)

## 2021-02-01 LAB — COMPLETE METABOLIC PANEL WITH GFR
AG Ratio: 1.3 (calc) (ref 1.0–2.5)
ALT: 37 U/L (ref 9–46)
AST: 24 U/L (ref 10–35)
Albumin: 3.9 g/dL (ref 3.6–5.1)
Alkaline phosphatase (APISO): 89 U/L (ref 35–144)
BUN: 9 mg/dL (ref 7–25)
CO2: 30 mmol/L (ref 20–32)
Calcium: 9.3 mg/dL (ref 8.6–10.3)
Chloride: 104 mmol/L (ref 98–110)
Creat: 1.12 mg/dL (ref 0.70–1.35)
Globulin: 3.1 g/dL (calc) (ref 1.9–3.7)
Glucose, Bld: 91 mg/dL (ref 65–99)
Potassium: 4.2 mmol/L (ref 3.5–5.3)
Sodium: 142 mmol/L (ref 135–146)
Total Bilirubin: 0.6 mg/dL (ref 0.2–1.2)
Total Protein: 7 g/dL (ref 6.1–8.1)
eGFR: 74 mL/min/{1.73_m2} (ref >=60)

## 2021-02-01 NOTE — Progress Notes (Signed)
Acute Office Visit  Subjective:    Patient ID: Ronald Bradford, male    DOB: September 01, 1955, 65 y.o.   MRN: [id number]  Chief Complaint  Patient presents with   Cyst    Or Lipoma, it does not hurt.  Getting bigger onset 1 year    HPI Patient is in today for growth on face. He has had this for at least 2 years and it has never been evaluated. It has been growing steadily and is now the size of an orange. He first noticed it when he had right sided dental pain and the mass developed on the right side of his face/jaw and is now extending under his ear. He denies pain, itchy or drainage. He denies the mass being warm to the touch or any color change. He denies any other skin changes. He denies fever, current dental pain, ear pain, sinus pain/pressure. He has no issues eating, swallowing or breathing. Nothing makes the mass better or worse. He is a current smoker, smoking about 2 ppd for the last 40+ years.   Hypertension: -Medications: Amlodipine 10 mg -Patient is compliant with above medications and reports no side effects. -Checking BP at home (average): daughter checks it for him, ranges from 130-160 -Denies any SOB, CP, vision changes, LE edema or symptoms of hypotension  HLD:  -Medications: Lipitor 20 mg -Patient is compliant with above medications and reports no side effects.  -Last lipid panel: 3/20: TC 175, HDL 541, triglycerides 242, LDL 97  Schizophrenia:  -Currently on Risperdal daily, doing well  -Seeing psychiatry  Past Medical History:  Diagnosis Date   Bipolar 1 disorder ([place])    Schizoaffective disorder (HCC)     Past Surgical History:  Procedure Laterality Date   HIP SURGERY Right 2000   not sure what was done, not sure if ORIF, no pins    Family History  Problem Relation Age of Onset   Hypertension Mother    Heart attack Father    Hypertension Father    Hypertension Sister    Schizophrenia Sister    Hypertension Daughter    Heart attack Maternal  Grandfather    Cancer Brother    Hypertension Brother    Hypertension Brother    Hypertension Brother    Hypertension Brother    Hypertension Brother     Social History   Socioeconomic History   Marital status: Single    Spouse name: Not on file   Number of children: 1   Years of education: 12   Highest education level: Not on file  Occupational History   Occupation: unemloyed: former Therapist, art  Tobacco Use   Smoking status: Every Day    Packs/day: 1.00    Types: Cigarettes   Smokeless tobacco: Former  Scientific laboratory technician Use: Never used  Substance and Sexual Activity   Alcohol use: Yes    Comment: on holidays   Drug use: Not Currently   Sexual activity: Yes  Other Topics Concern   Not on file  Social History Narrative   Not on file   Social Determinants of Health   Financial Resource Strain: Not on file  Food Insecurity: Not on file  Transportation Needs: Not on file  Physical Activity: Not on file  Stress: Not on file  Social Connections: Not on file  Intimate Partner Violence: Not on file    Outpatient Medications Prior to Visit  Medication Sig Dispense Refill   amLODipine (NORVASC) 10 MG tablet Take 1  tablet (10 mg total) by mouth daily. 90 tablet 3   atorvastatin (LIPITOR) 20 MG tablet Take 1 tablet (20 mg total) by mouth at bedtime. 90 tablet 3   risperiDONE (RISPERDAL) 3 MG tablet Take 5 mg by mouth daily.     No facility-administered medications prior to visit.    Allergies  Allergen Reactions   Haloperidol Lactate Other (See Comments)    Review of Systems  Constitutional:  Negative for chills and fever.  HENT:  Positive for facial swelling. Negative for congestion, dental problem, drooling, ear pain, sinus pressure and sinus pain.   Eyes:  Negative for visual disturbance.  Respiratory:  Negative for cough and shortness of breath.   Cardiovascular:  Negative for chest pain.  Gastrointestinal:  Negative for abdominal pain.  Neurological:  Negative  for dizziness and headaches.      Objective:    Physical Exam Constitutional:      Appearance: Normal appearance.  HENT:     Head: Normocephalic and atraumatic.     Comments: About 3x3 fixed, soft tissue mass over right mandible, extending over maxilla, no drainage or sign of infection    Nose: Nose normal.     Mouth/Throat:     Mouth: Mucous membranes are moist.     Pharynx: Oropharynx is clear.  Eyes:     Conjunctiva/sclera: Conjunctivae normal.  Cardiovascular:     Rate and Rhythm: Normal rate and regular rhythm.  Pulmonary:     Effort: Pulmonary effort is normal.     Breath sounds: Normal breath sounds.  Abdominal:     General: There is no distension.     Palpations: Abdomen is soft.     Tenderness: There is no abdominal tenderness.  Musculoskeletal:     Right lower leg: No edema.     Left lower leg: No edema.  Skin:    General: Skin is warm and dry.  Neurological:     General: No focal deficit present.     Mental Status: He is alert. Mental status is at baseline.  Psychiatric:        Mood and Affect: Mood normal.        Behavior: Behavior normal.    BP (!) 145/75   Pulse 88   Temp 97.6 F (36.4 C)   Resp 16   Wt 212 lb 6.4 oz (96.3 kg)   SpO2 95%   BMI 31.37 kg/m  Wt Readings from Last 3 Encounters:  02/01/21 212 lb 6.4 oz (96.3 kg)  07/11/19 225 lb (102.1 kg)  10/25/18 201 lb 8 oz (91.4 kg)    Health Maintenance Due  Topic Date Due   Pneumococcal Vaccine 64-21 Years old (1 - PCV) Never done   COLONOSCOPY (Pts 45-71yrs Insurance coverage will need to be confirmed)  Never done   Zoster Vaccines- Shingrix (1 of 2) Never done    There are no preventive care reminders to display for this patient.   No results found for: TSH Lab Results  Component Value Date   WBC 4.3 05/07/2018   HGB 16.2 05/07/2018   HCT 46.7 05/07/2018   MCV 86.3 05/07/2018   PLT 191 05/07/2018   Lab Results  Component Value Date   NA 141 05/07/2018   K 4.5 05/07/2018    CO2 30 05/07/2018   GLUCOSE 78 05/07/2018   BUN 10 05/07/2018   CREATININE 1.12 05/07/2018   BILITOT 0.6 05/07/2018   AST 16 05/07/2018   ALT 14 05/07/2018   PROT  7.3 05/07/2018   CALCIUM 10.2 05/07/2018   Lab Results  Component Value Date   CHOL 175 05/07/2018   Lab Results  Component Value Date   HDL 41 05/07/2018   Lab Results  Component Value Date   LDLCALC 97 05/07/2018   Lab Results  Component Value Date   TRIG 242 (H) 05/07/2018   Lab Results  Component Value Date   CHOLHDL 4.3 05/07/2018   Lab Results  Component Value Date   HGBA1C 5.6 05/07/2018       Assessment & Plan:   1. Mass of soft tissue of face/Swelling of right parotid gland: Uncertain etiology but it is very large and starting to compress surrounding structures. Maxillofacial CT ordered for further evaluation, possible parotid mass in the setting of long time smoker. CBC, CMP ordered today as well. Follow up in 1 month.  - CBC w/Diff/Platelet - COMPLETE METABOLIC PANEL WITH GFR - CT Maxillofacial WO CM; Future  2. Benign hypertension: Initially BP elevated here, on recheck was 145/75. Continue Amlodipine 10 mg, recheck labs above. Follow up in 1 month for recheck.   - COMPLETE METABOLIC PANEL WITH GFR  3. Dyslipidemia: Stable, continue statin, recheck lipid panel today.  - Lipid Profile  4. Need for pneumococcal vaccination: [id number] 37 today.   - Pneumococcal conjugate vaccine 20-valent (Prevnar 20)  Follow up: 1 month   Teodora Medici, DO

## 2021-02-01 NOTE — Patient Instructions (Signed)
It was great seeing you today!  Plan discussed at today's visit: -Blood work ordered today, we will call you with results. -CT ordered, if you don't hear anything please call to follow up -Prevnar 20 for pneumonia given today  Follow up in: 1 month   Take care and let us know if you have any questions or concerns prior to your next visit.  Dr. Rosana Berger

## 2021-03-01 ENCOUNTER — Ambulatory Visit: Payer: Medicaid Other | Admitting: Internal Medicine

## 2021-03-04 ENCOUNTER — Telehealth: Payer: Self-pay | Admitting: Family Medicine

## 2021-03-04 ENCOUNTER — Ambulatory Visit: Admission: RE | Admit: 2021-03-04 | Payer: Medicaid Other | Source: Ambulatory Visit

## 2021-03-04 NOTE — Telephone Encounter (Signed)
Ronald Bradford from [facility] called in, needs clarity on ct san whether its wirth or without contrast Please call  back

## 2021-03-04 NOTE — Telephone Encounter (Signed)
Spoke to [place] from [facility] and verbalized without contrast as I have confirmed with Dr.Andrews.

## 2021-03-04 NOTE — Telephone Encounter (Signed)
The order put in says without, just to confirm with you Dr.Andrews?

## 2021-04-05 ENCOUNTER — Other Ambulatory Visit: Payer: Self-pay

## 2021-04-05 ENCOUNTER — Ambulatory Visit
Admission: RE | Admit: 2021-04-05 | Discharge: 2021-04-05 | Disposition: A | Discharge status: Home / Self Care | Payer: Medicaid Other | Source: Ambulatory Visit | Attending: Internal Medicine | Admitting: Internal Medicine

## 2021-04-05 DIAGNOSIS — R600 Localized edema: Secondary | ICD-10-CM | POA: Insufficient documentation

## 2021-04-05 DIAGNOSIS — M7989 Other specified soft tissue disorders: Secondary | ICD-10-CM | POA: Diagnosis not present

## 2021-04-06 ENCOUNTER — Other Ambulatory Visit: Payer: Self-pay | Admitting: Internal Medicine

## 2021-04-06 DIAGNOSIS — K118 Other diseases of salivary glands: Secondary | ICD-10-CM

## 2021-05-13 ENCOUNTER — Telehealth: Payer: Self-pay | Admitting: Family Medicine

## 2021-05-13 DIAGNOSIS — K118 Other diseases of salivary glands: Secondary | ICD-10-CM

## 2021-05-13 NOTE — Telephone Encounter (Signed)
[facility] called b/c the pt "no showed" today and the referral was marked URGENT.  ?The dr there, Dr Pryor Ochoa, looked at the referral and he said the pt needs to be referred to: ?[facility] ; Dr Frazier Butt. ?

## 2021-05-13 NOTE — Telephone Encounter (Signed)
Called cell # which is daughters and left detailed vm and importance of this new referral for biopsy.  Other # no one answered and no vm ?

## 2021-05-13 NOTE — Telephone Encounter (Signed)
Referral was placed by you Dr.Andrews just want to make sure you ok with new referral needed to be placed for this patient. ?

## 2021-11-02 ENCOUNTER — Ambulatory Visit: Payer: Medicaid Other | Admitting: Family Medicine

## 2021-11-10 ENCOUNTER — Ambulatory Visit (INDEPENDENT_AMBULATORY_CARE_PROVIDER_SITE_OTHER): Payer: Medicaid Other | Admitting: Family Medicine

## 2021-11-10 ENCOUNTER — Encounter: Payer: Self-pay | Admitting: Family Medicine

## 2021-11-10 VITALS — BP 150/92 | HR 72 | Temp 98.2°F | Resp 16 | Ht 66.0 in | Wt 205.2 lb

## 2021-11-10 DIAGNOSIS — D110 Benign neoplasm of parotid gland: Secondary | ICD-10-CM

## 2021-11-10 DIAGNOSIS — Z4802 Encounter for removal of sutures: Secondary | ICD-10-CM | POA: Diagnosis not present

## 2021-11-10 DIAGNOSIS — I10 Essential (primary) hypertension: Secondary | ICD-10-CM

## 2021-11-10 DIAGNOSIS — Z09 Encounter for follow-up examination after completed treatment for conditions other than malignant neoplasm: Secondary | ICD-10-CM | POA: Diagnosis not present

## 2021-11-10 MED ORDER — AMLODIPINE BESYLATE 10 MG PO TABS: 10.0000 mg | ORAL_TABLET | Freq: Every day | ORAL | 3 refills | Status: DC

## 2021-11-10 NOTE — Progress Notes (Signed)
Patient ID: Ronald Bradford, male    DOB: [birth year], 66 y.o.   MRN: [id number]  PCP: Delsa Grana, PA-C  Chief Complaint  Patient presents with   Suture / Staple Removal    Staples have been on for 3 months, itches a lot. Pt daughter would like a referral if needed for [place], [place], [place] somewhere close.    Subjective:   Ronald Bradford is a 66 y.o. male, presents to clinic with CC of the following:  HPI  Pt presents for staple removal for surgery from 3+ months ago surgery at [facility] could not get ride back to them to follow up Had parotid mass, FNA was done then surgery and pathology was neg for malignancy  Trouble with transportation back to surgeon - there are notes in care everywhere where the oncology ENT noted needing local specialist to f/up with - cannot see the referral and pt doesn't think he has an appt and he is not sure about what follow up is needed Incision intact, no redness, drainage, swelling He has experienced some itching only   Hypertension:  BP elevated, he has not been in for meds or f/up in over 2 years, meds expired, not takingf Blood pressure today is uncontrolled. BP Readings from Last 3 Encounters:  11/10/21 (!) 150/92  02/01/21 (!) 145/75  10/25/18 132/66   Pt denies CP, SOB, exertional sx, LE edema, palpitation, Ha's, visual disturbances, lightheadedness, hypotension, syncope. Intermittently having some reflux/burning sx Dietary efforts for BP?  none     Patient Active Problem List   Diagnosis Date Noted   Dyslipidemia 10/18/2018   Tobacco use 10/18/2018   Chronic pain of right hip 05/12/2018   Localized swelling, mass and lump, head 05/12/2018   Benign hypertension 05/12/2018   Medication monitoring encounter 05/12/2018   Schizophrenia ([place]) 05/12/2018      Current Outpatient Medications:    amLODipine (NORVASC) 10 MG tablet, Take 1 tablet (10 mg total) by mouth daily., Disp: 90 tablet, Rfl: 3   atorvastatin  (LIPITOR) 20 MG tablet, Take 1 tablet (20 mg total) by mouth at bedtime. (Patient not taking: Reported on 11/10/2021), Disp: 90 tablet, Rfl: 3   risperiDONE (RISPERDAL) 3 MG tablet, Take 5 mg by mouth daily. (Patient not taking: Reported on 11/10/2021), Disp: , Rfl:    Allergies  Allergen Reactions   Haloperidol Lactate Other (See Comments)     Social History   Tobacco Use   Smoking status: Every Day    Packs/day: 1.00    Types: Cigarettes   Smokeless tobacco: Former  Scientific laboratory technician Use: Never used  Substance Use Topics   Alcohol use: Yes    Comment: on holidays   Drug use: Not Currently      Chart Review Today: I personally reviewed active problem list, medication list, allergies, family history, social history, health maintenance, notes from last encounter, lab results, imaging with the patient/caregiver today.   Review of Systems  Constitutional: Negative.   HENT: Negative.    Eyes: Negative.   Respiratory: Negative.    Cardiovascular: Negative.   Gastrointestinal: Negative.   Endocrine: Negative.   Genitourinary: Negative.   Musculoskeletal: Negative.   Skin: Negative.   Allergic/Immunologic: Negative.   Neurological: Negative.   Hematological: Negative.   Psychiatric/Behavioral: Negative.    All other systems reviewed and are negative.      Objective:   Vitals:   11/10/21 1034 11/10/21 1132  BP: (!) 170/100 (!) 150/92  Pulse: 72   Resp: 16   Temp: 98.2 F (36.8 C)   TempSrc: Oral   SpO2: 95%   Weight: 205 lb 3.2 oz (93.1 kg)   Height: '5\' 6"'  (1.676 m)     Body mass index is 33.12 kg/m.  Physical Exam Vitals and nursing note reviewed.  Constitutional:      General: He is not in acute distress.    Appearance: He is well-developed. He is not ill-appearing, toxic-appearing or diaphoretic.  HENT:     Head: Normocephalic and atraumatic.     Nose: Nose normal.  Eyes:     General:        Right eye: No discharge.        Left eye: No  discharge.     Conjunctiva/sclera: Conjunctivae normal.  Neck:     Trachea: No tracheal deviation.     Comments: Right neck incision with 26 staples- curved from right angle of mandible to right mid neck, clean dry intact, no induration, no tenderness Cardiovascular:     Rate and Rhythm: Normal rate and regular rhythm.     Pulses: Normal pulses.     Heart sounds: Normal heart sounds.  Pulmonary:     Effort: Pulmonary effort is normal. No respiratory distress.     Breath sounds: No stridor.  Musculoskeletal:     Right lower leg: No edema.     Left lower leg: No edema.  Skin:    General: Skin is warm and dry.     Findings: No rash.  Neurological:     Mental Status: He is alert.     Motor: No abnormal muscle tone.     Coordination: Coordination normal.  Psychiatric:        Behavior: Behavior normal. Behavior is cooperative.     Comments: pleasant           Results for orders placed or performed in visit on 02/01/21  CBC w/Diff/Platelet  Result Value Ref Range   WBC 4.3 3.8 - 10.8 Thousand/uL   RBC 5.85 (H) 4.20 - 5.80 Million/uL   Hemoglobin 17.5 (H) 13.2 - 17.1 g/dL   HCT 51.5 (H) 38.5 - 50.0 %   MCV 88.0 80.0 - 100.0 fL   MCH 29.9 27.0 - 33.0 pg   MCHC 34.0 32.0 - 36.0 g/dL   RDW 14.7 11.0 - 15.0 %   Platelets 297 140 - 400 Thousand/uL   MPV 10.7 7.5 - 12.5 fL   Neutro Abs 1,995 1,500 - 7,800 cells/uL   Lymphs Abs 1,694 850 - 3,900 cells/uL   Absolute Monocytes 482 200 - 950 cells/uL   Eosinophils Absolute 99 15 - 500 cells/uL   Basophils Absolute 30 0 - 200 cells/uL   Neutrophils Relative % 46.4 %   Total Lymphocyte 39.4 %   Monocytes Relative 11.2 %   Eosinophils Relative 2.3 %   Basophils Relative 0.7 %  COMPLETE METABOLIC PANEL WITH GFR  Result Value Ref Range   Glucose, Bld 91 65 - 99 mg/dL   BUN 9 7 - 25 mg/dL   Creat 1.12 0.70 - 1.35 mg/dL   eGFR 74 > OR = 60 mL/min/1.40m   BUN/Creatinine Ratio NOT APPLICABLE 6 - 22 (calc)   Sodium 142 135 - 146  mmol/L   Potassium 4.2 3.5 - 5.3 mmol/L   Chloride 104 98 - 110 mmol/L   CO2 30 20 - 32 mmol/L   Calcium 9.3 8.6 - 10.3 mg/dL   Total Protein 7.0  6.1 - 8.1 g/dL   Albumin 3.9 3.6 - 5.1 g/dL   Globulin 3.1 1.9 - 3.7 g/dL (calc)   AG Ratio 1.3 1.0 - 2.5 (calc)   Total Bilirubin 0.6 0.2 - 1.2 mg/dL   Alkaline phosphatase (APISO) 89 35 - 144 U/L   AST 24 10 - 35 U/L   ALT 37 9 - 46 U/L  Lipid Profile  Result Value Ref Range   Cholesterol 124 <200 mg/dL   HDL 36 (L) > OR = 40 mg/dL   Triglycerides 208 (H) <150 mg/dL   LDL Cholesterol (Calc) 61 mg/dL (calc)   Total CHOL/HDL Ratio 3.4 <5.0 (calc)   Non-HDL Cholesterol (Calc) 88 <130 mg/dL (calc)   Suture Removal  Date/Time: 11/10/2021 12:48 PM  Performed by: Delsa Grana, PA-C Authorized by: Delsa Grana, PA-C  Body area: head/neck Location details: neck Wound Appearance: clean Staples Removed: 26 Patient tolerance: patient tolerated the procedure well with no immediate complications Comments: See images        Assessment & Plan:     ICD-10-CM   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 Benign hypertension  I10 amLODipine (NORVASC) 10 MG tablet   uncontrolled? with home BP cuff?  needs in person OV, exam and labs - pt and daughter notified to come in person ASAP for OV, no sx, discussed concerning sx    3. Encounter for removal of staples  Z48.02 Ambulatory referral to ENT   26 staples removed, only few spots wth scant bleeding/discharge, tolerated, surgical incision C, D, I discussed cotinued scar/incision care and healing    4. Encounter for examination following treatment at hospital  Z09 Ambulatory referral to ENT   care everywhere [facility] specialists OV surgery and other documentation reviewed today, pathology reviewed      Pt instructed to get 3 week f/up after starting to take BP med again   Delsa Grana,  PA-C 11/10/21 12:51 PM

## 2023-03-07 ENCOUNTER — Emergency Department
Admission: EM | Admit: 2023-03-07 | Discharge: 2023-03-07 | Disposition: A | Discharge status: Home / Self Care | Payer: Medicare HMO | Attending: Emergency Medicine | Admitting: Emergency Medicine

## 2023-03-07 DIAGNOSIS — I10 Essential (primary) hypertension: Secondary | ICD-10-CM | POA: Diagnosis not present

## 2023-03-07 DIAGNOSIS — L299 Pruritus, unspecified: Secondary | ICD-10-CM

## 2023-03-07 HISTORY — DX: Essential (primary) hypertension: I10

## 2023-03-07 MED ORDER — HYDROXYZINE HCL 25 MG PO TABS: 25.0000 mg | ORAL_TABLET | Freq: Three times a day (TID) | ORAL | 0 refills | Status: AC | PRN

## 2023-03-07 MED ORDER — AMLODIPINE BESYLATE 10 MG PO TABS: 10.0000 mg | ORAL_TABLET | Freq: Every day | ORAL | 1 refills | Status: AC

## 2023-03-07 MED ORDER — AMLODIPINE BESYLATE 5 MG PO TABS
10.0000 mg | ORAL_TABLET | Freq: Once | ORAL | Status: AC
  Administered 2023-03-07: 10 mg via ORAL
  Filled 2023-03-07: qty 2

## 2023-03-07 NOTE — ED Triage Notes (Addendum)
 Pt to ED for possible bed bugs. States sees bugs in his house that he sees in his daughters house. Reports itching all over. States shaved all his hair d/t itching.  No bugs visualized at this time.  Pt states stopped taking bp meds

## 2023-03-07 NOTE — ED Notes (Signed)
 See triage notes. Patient c/o itching all over. Patient possibly has bed bugs.

## 2023-03-07 NOTE — ED Triage Notes (Signed)
 FIRST RN: pt from home, had itchy back, scratched it with a razor and cut it.

## 2023-03-07 NOTE — ED Provider Triage Note (Signed)
 Emergency Medicine Provider Triage Evaluation Note  Ronald Bradford , a 69 y.o. male  was evaluated in triage.  Pt complains of sensation of something crawling all over him. He has been itching all over and has shaved his entire body, but that didn't help. He sees little white freckles that get bigger and then something comes out of them. Denies SI/HI.   Physical Exam  Pulse 72   Temp 98.3 F (36.8 C)   Resp 18   Ht 5' 7 (1.702 m)   Wt 86.2 kg   SpO2 95%   BMI 29.76 kg/m  Gen:   Awake, no distress   Resp:  Normal effort  MSK:   Moves extremities without difficulty  Other:    Medical Decision Making  Medically screening exam initiated at 12:35 PM.  Appropriate orders placed.  Ronald Bradford was informed that the remainder of the evaluation will be completed by another provider, this initial triage assessment does not replace that evaluation, and the importance of remaining in the ED until their evaluation is complete.  Labs sent.   Ronald Kirk NOVAK, FNP 03/07/23 1419

## 2023-03-07 NOTE — ED Provider Notes (Signed)
 [facility] Provider Note    Event Date/Time   First MD Initiated Contact with Patient 03/07/23 1349     (approximate)   History   Chief Complaint Pruritis   HPI  Ronald Bradford is a 68 y.o. male with past medical history of hypertension and schizophrenia who presents to the ED complaining of itching.  Patient reports that he has had 3 days of itching across his entire body which she is concerned is related to bedbugs or other insect.  He states that he first saw bugs in his daughter's home, has more recently began to notice similar bugs in his home.  He describes small black bugs that at times he has found on his body with severe itching to both of his legs and back.  He states that he has shaved the hair off of his skin to try to help with the itching, has not taken any medication for it.  He has not noticed any redness or swelling anywhere on his skin, denies any fevers.  He states he has not taken his blood pressure medication for multiple months, denies any complaints related to this.     Physical Exam   Triage Vital Signs: ED Triage Vitals  Encounter Vitals Group     BP 03/07/23 1235 (!) 207/109     Systolic BP Percentile --      Diastolic BP Percentile --      Pulse Rate 03/07/23 1233 72     Resp 03/07/23 1233 18     Temp 03/07/23 1233 98.3 F (36.8 C)     Temp src --      SpO2 03/07/23 1233 95 %     Weight 03/07/23 1235 190 lb (86.2 kg)     Height 03/07/23 1235 5' 7 (1.702 m)     Head Circumference --      Peak Flow --      Pain Score 03/07/23 1234 6     Pain Loc --      Pain Education --      Exclude from Growth Chart --     Most recent vital signs: Vitals:   03/07/23 1233 03/07/23 1235  BP:  (!) 207/109  Pulse: 72   Resp: 18   Temp: 98.3 F (36.8 C)   SpO2: 95%     Constitutional: Alert and oriented. Eyes: Conjunctivae are normal. Head: Atraumatic. Nose: No congestion/rhinnorhea. Mouth/Throat: Mucous membranes are  moist.  Cardiovascular: Normal rate, regular rhythm. Grossly normal heart sounds.  2+ radial pulses bilaterally. Respiratory: Normal respiratory effort.  No retractions. Lungs CTAB. Gastrointestinal: Soft and nontender. No distention. Musculoskeletal: No lower extremity tenderness nor edema.  Superficial abrasions to lower back as well as both shins. Neurologic:  Normal speech and language. No gross focal neurologic deficits are appreciated.    ED Results / Procedures / Treatments   Labs (all labs ordered are listed, but only abnormal results are displayed) Labs Reviewed - No data to display   PROCEDURES:  Critical Care performed: No  Procedures   MEDICATIONS ORDERED IN ED: Medications  amLODipine  (NORVASC ) tablet 10 mg (10 mg Oral Given 03/07/23 1431)     IMPRESSION / MDM / ASSESSMENT AND PLAN / ED COURSE  I reviewed the triage vital signs and the nursing notes.                              68 y.o. male  with past medical history of hypertension and schizophrenia presents to the ED for 3 days of itching across his entire body which she attributes to bugs he has seen at home.  Patient's presentation is most consistent with acute, uncomplicated illness.  Differential diagnosis includes, but is not limited to, bedbugs, hives, delusional disorder, uncontrolled hypertension.  Patient nontoxic-appearing and in no acute distress, vital signs remarkable for hypertension but otherwise reassuring.  Patient has superficial abrasions to both shins as well as his lower back where he reports shaving off his hair to help deal with the itching.  No obvious insects or insect bites noted, no findings concerning for allergic reaction.  While psychiatric illness could be contributing to his symptoms, he does not represent a threat to himself or others and no indication for psychiatric evaluation at this time.  He is hypertensive but has no complaints related to this, no evidence of hypertensive  emergency.  Patient appropriate for outpatient management, we will restart his amlodipine  and prescribe Atarax  for use as needed.  He was counseled to reestablish care with PCP and to return to the ED for new or worsening symptoms.  Patient agrees with plan.      FINAL CLINICAL IMPRESSION(S) / ED DIAGNOSES   Final diagnoses:  Pruritus  Uncontrolled hypertension     Rx / DC Orders   ED Discharge Orders          Ordered    amLODipine  (NORVASC ) 10 MG tablet  Daily        03/07/23 1423    hydrOXYzine  (ATARAX ) 25 MG tablet  3 times daily PRN        03/07/23 1424    Ambulatory Referral to Primary Care (Establish Care)        03/07/23 1424             Note:  This document was prepared using Dragon voice recognition software and may include unintentional dictation errors.   Willo Dunnings, MD 03/07/23 [phone]
